# Patient Record
Sex: Male | Born: 1980 | Race: Black or African American | Hispanic: No | Marital: Single | Smoking: Current every day smoker
Health system: Southern US, Community
[De-identification: ages and names within clinical notes are randomized; demographics above are authoritative.]

## PROBLEM LIST (undated history)

## (undated) DIAGNOSIS — I2510 Atherosclerotic heart disease of native coronary artery without angina pectoris: Secondary | ICD-10-CM

## (undated) DIAGNOSIS — I219 Acute myocardial infarction, unspecified: Secondary | ICD-10-CM

## (undated) DIAGNOSIS — Z720 Tobacco use: Secondary | ICD-10-CM

## (undated) DIAGNOSIS — I10 Essential (primary) hypertension: Secondary | ICD-10-CM

## (undated) DIAGNOSIS — I739 Peripheral vascular disease, unspecified: Secondary | ICD-10-CM

---
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
## 1999-10-21 ENCOUNTER — Emergency Department (HOSPITAL_COMMUNITY): Admission: EM | Admit: 1999-10-21 | Discharge: 1999-10-21 | Payer: Self-pay | Admitting: Emergency Medicine

## 1999-10-21 ENCOUNTER — Encounter: Payer: Self-pay | Admitting: *Deleted

## 2008-03-16 ENCOUNTER — Emergency Department (HOSPITAL_COMMUNITY): Admission: EM | Admit: 2008-03-16 | Discharge: 2008-03-16 | Payer: Self-pay | Admitting: Emergency Medicine

## 2009-05-19 ENCOUNTER — Emergency Department (HOSPITAL_COMMUNITY): Admission: EM | Admit: 2009-05-19 | Discharge: 2009-05-19 | Payer: Self-pay | Admitting: Emergency Medicine

## 2011-03-25 ENCOUNTER — Emergency Department (HOSPITAL_COMMUNITY): Payer: Self-pay

## 2011-03-25 ENCOUNTER — Emergency Department (HOSPITAL_COMMUNITY)
Admission: EM | Admit: 2011-03-25 | Discharge: 2011-03-25 | Disposition: A | Discharge status: Home / Self Care | Payer: Self-pay | Attending: Emergency Medicine | Admitting: Emergency Medicine

## 2011-03-25 DIAGNOSIS — S0990XA Unspecified injury of head, initial encounter: Secondary | ICD-10-CM | POA: Insufficient documentation

## 2011-03-25 DIAGNOSIS — F411 Generalized anxiety disorder: Secondary | ICD-10-CM | POA: Insufficient documentation

## 2011-03-25 DIAGNOSIS — H538 Other visual disturbances: Secondary | ICD-10-CM | POA: Insufficient documentation

## 2011-03-25 DIAGNOSIS — IMO0002 Reserved for concepts with insufficient information to code with codable children: Secondary | ICD-10-CM | POA: Insufficient documentation

## 2011-03-25 DIAGNOSIS — R0789 Other chest pain: Secondary | ICD-10-CM | POA: Insufficient documentation

## 2011-03-25 DIAGNOSIS — R0602 Shortness of breath: Secondary | ICD-10-CM | POA: Insufficient documentation

## 2012-11-11 ENCOUNTER — Emergency Department (HOSPITAL_COMMUNITY)
Admission: EM | Admit: 2012-11-11 | Discharge: 2012-11-11 | Disposition: A | Discharge status: Home / Self Care | Payer: BC Managed Care – PPO | Source: Home / Self Care

## 2012-11-11 ENCOUNTER — Encounter (HOSPITAL_COMMUNITY): Payer: Self-pay | Admitting: Emergency Medicine

## 2012-11-11 DIAGNOSIS — J309 Allergic rhinitis, unspecified: Secondary | ICD-10-CM

## 2012-11-11 DIAGNOSIS — H6983 Other specified disorders of Eustachian tube, bilateral: Secondary | ICD-10-CM

## 2012-11-11 DIAGNOSIS — H6980 Other specified disorders of Eustachian tube, unspecified ear: Secondary | ICD-10-CM

## 2012-11-11 DIAGNOSIS — H6500 Acute serous otitis media, unspecified ear: Secondary | ICD-10-CM

## 2012-11-11 HISTORY — DX: Acute myocardial infarction, unspecified: I21.9

## 2012-11-11 MED ORDER — AMOXICILLIN-POT CLAVULANATE 875-125 MG PO TABS: 1.0000 | ORAL_TABLET | Freq: Two times a day (BID) | ORAL | Status: DC

## 2012-11-11 NOTE — ED Notes (Signed)
Pt c/o bilateral ear pain onset 2 months Pain has increased these past 2 days Denies: drainage, f/v/n/d, cold sx States he feels like fluid is inside   He is alert and oriented w/no signs of acute distress.

## 2012-11-11 NOTE — ED Provider Notes (Signed)
9History     CSN: [id number]  Arrival date & time 11/11/12  1204   None     Chief Complaint  Patient presents with  . Otalgia    (Consider location/radiation/quality/duration/timing/severity/associated sxs/prior treatment) HPI Comments: 32 year old male with complaints of right and left earache for over 2 months. In the past 2 days of right ear pain has worsened. He complains of his ears feeling stopped up and a foreign body sensation. He denies sore throat, fever or problems with hearing or swallowing.   Past Medical History  Diagnosis Date  . Heart attack     History reviewed. No pertinent past surgical history.  No family history on file.  History  Substance Use Topics  . Smoking status: Current Every Day Smoker  . Smokeless tobacco: Not on file  . Alcohol Use: No      Review of Systems  Constitutional: Negative for fever, diaphoresis, activity change and fatigue.  HENT: Positive for postnasal drip. Negative for ear pain, sore throat, facial swelling, rhinorrhea, trouble swallowing, neck pain and neck stiffness.   Eyes: Negative for pain, discharge and redness.  Respiratory: Negative for cough, chest tightness and shortness of breath.   Cardiovascular: Negative.   Gastrointestinal: Negative.   Musculoskeletal: Negative.   Neurological: Negative.     Allergies  Review of patient's allergies indicates no known allergies.  Home Medications   Current Outpatient Rx  Name  Route  Sig  Dispense  Refill  . amoxicillin-clavulanate (AUGMENTIN) 875-125 MG per tablet   Oral   Take 1 tablet by mouth every 12 (twelve) hours.   14 tablet   0     BP 131/88  Pulse 59  Temp(Src) 97.8 F (36.6 C) (Oral)  Resp 16  SpO2 96%  Physical Exam  Nursing note and vitals reviewed. Constitutional: He is oriented to person, place, and time. He appears well-developed and well-nourished. No distress.  HENT:  Right TM with minor erythema and bulging with serous fluid the  dependent position. Left TM. Normal the exception of retraction. Oropharynx clear with minor clear PND.  Eyes: Conjunctivae and EOM are normal.  Neck: Normal range of motion. Neck supple.  Cardiovascular: Normal rate, regular rhythm and normal heart sounds.   Pulmonary/Chest: Effort normal and breath sounds normal. No respiratory distress. He has no wheezes. He has no rales.  Musculoskeletal: Normal range of motion. He exhibits no edema.  Lymphadenopathy:    He has no cervical adenopathy.  Neurological: He is alert and oriented to person, place, and time.  Skin: Skin is warm and dry. No rash noted.  Psychiatric: He has a normal mood and affect.    ED Course  Procedures (including critical care time)  Labs Reviewed - No data to display No results found.   1. ETD (eustachian tube dysfunction), bilateral   2. Otitis media, acute serous, left   3. Allergic rhinitis       MDM  Augmentin 875 twice a day for 7 days Sudafed PE 10 mg every 4 hours Guaifenesin 100 200 mg every 4-6 hours when necessary congestion Claritin 10 mg daily Plenty of fluids        Hayden Rasmussen, NP 11/11/12 1458

## 2012-11-20 NOTE — ED Provider Notes (Signed)
Medical screening examination/treatment/procedure(s) were performed by resident physician or non-physician practitioner and as supervising physician I was immediately available for consultation/collaboration.   Taura Lamarre DOUGLAS MD.   Darlyn Repsher D Alayiah Fontes, MD 11/20/12 1015 

## 2014-06-21 ENCOUNTER — Other Ambulatory Visit: Payer: Self-pay | Admitting: Family Medicine

## 2014-06-21 DIAGNOSIS — R29898 Other symptoms and signs involving the musculoskeletal system: Secondary | ICD-10-CM

## 2014-06-21 DIAGNOSIS — R200 Anesthesia of skin: Secondary | ICD-10-CM

## 2014-06-22 ENCOUNTER — Ambulatory Visit
Admission: RE | Admit: 2014-06-22 | Discharge: 2014-06-22 | Disposition: A | Discharge status: Home / Self Care | Payer: Medicaid Other | Source: Ambulatory Visit | Attending: Family Medicine | Admitting: Family Medicine

## 2014-06-22 DIAGNOSIS — R200 Anesthesia of skin: Secondary | ICD-10-CM

## 2014-06-22 DIAGNOSIS — R29898 Other symptoms and signs involving the musculoskeletal system: Secondary | ICD-10-CM

## 2014-12-18 ENCOUNTER — Encounter (HOSPITAL_COMMUNITY): Payer: Self-pay | Admitting: *Deleted

## 2014-12-18 ENCOUNTER — Emergency Department (HOSPITAL_COMMUNITY)
Admission: EM | Admit: 2014-12-18 | Discharge: 2014-12-18 | Disposition: A | Discharge status: Home / Self Care | Payer: Medicaid Other | Attending: Emergency Medicine | Admitting: Emergency Medicine

## 2014-12-18 DIAGNOSIS — H9201 Otalgia, right ear: Secondary | ICD-10-CM

## 2014-12-18 DIAGNOSIS — Z720 Tobacco use: Secondary | ICD-10-CM | POA: Insufficient documentation

## 2014-12-18 DIAGNOSIS — Z8679 Personal history of other diseases of the circulatory system: Secondary | ICD-10-CM | POA: Diagnosis not present

## 2014-12-18 MED ORDER — AMOXICILLIN-POT CLAVULANATE 875-125 MG PO TABS: 1.0000 | ORAL_TABLET | Freq: Two times a day (BID) | ORAL | Status: DC

## 2014-12-18 MED ORDER — IBUPROFEN 800 MG PO TABS: 800.0000 mg | ORAL_TABLET | Freq: Three times a day (TID) | ORAL | Status: DC

## 2014-12-18 MED ORDER — PSEUDOEPHEDRINE HCL 60 MG PO TABS: 60.0000 mg | ORAL_TABLET | Freq: Four times a day (QID) | ORAL | Status: DC | PRN

## 2014-12-18 NOTE — ED Notes (Signed)
Pt states that he has had rt ear pain for over one year pt [facility]

## 2014-12-18 NOTE — Discharge Instructions (Signed)
Please read and follow all provided instructions.  Your diagnoses today include:  1. Ear pain, right     Tests performed today include:  Vital signs. See below for your results today.   Medications prescribed:   Augmentin - antibiotic  You have been prescribed an antibiotic medicine: take the entire course of medicine even if you are feeling better. Stopping early can cause the antibiotic not to work.   Ibuprofen (Motrin, Advil) - anti-inflammatory pain medication  Do not exceed 600mg  ibuprofen every 6 hours, take with food  You have been prescribed an anti-inflammatory medication or NSAID. Take with food. Take smallest effective dose for the shortest duration needed for your pain. Stop taking if you experience stomach pain or vomiting.    Sudafed - medication to help open tubes  Take any prescribed medications only as directed.  Home care instructions:  Follow any educational materials contained in this packet.  BE VERY CAREFUL not to take multiple medicines containing Tylenol (also called acetaminophen). Doing so can lead to an overdose which can damage your liver and cause liver failure and possibly death.   Follow-up instructions: Please follow-up with your primary care provider or the ear doctor listed in the next 7 days for further evaluation of your symptoms.   Return instructions:   Please return to the Emergency Department if you experience worsening symptoms.   Please return if you have any other emergent concerns.  Additional Information:  Your vital signs today were: BP 135/85 mmHg   Pulse 89   Temp(Src) 97.9 F (36.6 C) (Oral)   Resp 18   SpO2 99% If your blood pressure (BP) was elevated above 135/85 this visit, please have this repeated by your doctor within one month. --------------

## 2014-12-18 NOTE — ED Provider Notes (Signed)
CSN: [id number]     Arrival date & time 12/18/14  1104 History   First MD Initiated Contact with Patient 12/18/14 1237     Chief Complaint  Patient presents with  . Otalgia     (Consider location/radiation/quality/duration/timing/severity/associated sxs/prior Treatment) HPI Comments: Patient resents with complaint of intermittent bilateral ear pain which is been occurring for at least the past 2 years, worsening right ear pain starting approximately 3 days ago. He has been seen in the past at urgent care and was diagnosed with eustachian tube dysfunction. Patient denies fever, nasal congestion or allergy symptoms, other URI symptoms. No problems with hearing. He states that he saw an ENT in the past and states that he had a normal exam. He was given some eardrops which did not help. Patient has been taking ibuprofen with some relief of pain.  The history is provided by the patient and medical records.    Past Medical History  Diagnosis Date  . Heart attack    History reviewed. No pertinent past surgical history. No family history on file. History  Substance Use Topics  . Smoking status: Current Every Day Smoker  . Smokeless tobacco: Not on file  . Alcohol Use: No    Review of Systems  Constitutional: Negative for fever, chills and fatigue.  HENT: Positive for ear pain. Negative for congestion, rhinorrhea, sinus pressure and sore throat.   Eyes: Negative for redness.  Respiratory: Negative for cough and wheezing.   Gastrointestinal: Negative for nausea, vomiting, abdominal pain and diarrhea.  Genitourinary: Negative for dysuria.  Musculoskeletal: Negative for myalgias and neck stiffness.  Skin: Negative for rash.  Neurological: Negative for headaches.  Hematological: Negative for adenopathy.    Allergies  Review of patient's allergies indicates no known allergies.  Home Medications   Prior to Admission medications   Medication Sig Start Date End Date Taking? Authorizing  Provider  amoxicillin-clavulanate (AUGMENTIN) 875-125 MG per tablet Take 1 tablet by mouth every 12 (twelve) hours. 11/11/12   Hayden Rasmussenavid Mabe, NP   BP 135/85 mmHg  Pulse 89  Temp(Src) 97.9 F (36.6 C) (Oral)  Resp 18  SpO2 99%   Physical Exam  Constitutional: He appears well-developed and well-nourished.  HENT:  Head: Normocephalic and atraumatic.  Right Ear: External ear and ear canal normal. No swelling or tenderness. Tympanic membrane is injected and erythematous. No middle ear effusion.  Left Ear: External ear and ear canal normal. Tympanic membrane is retracted.  Eyes: Conjunctivae are normal.  Neck: Normal range of motion. Neck supple.  Pulmonary/Chest: No respiratory distress.  Neurological: He is alert.  Skin: Skin is warm and dry.  Psychiatric: He has a normal mood and affect.  Nursing note and vitals reviewed.   ED Course  Procedures (including critical care time) Labs Review Labs Reviewed - No data to display  Imaging Review No results found.   EKG Interpretation None       12:54 PM Patient seen and examined.   Vital signs reviewed and are as follows: BP 135/85 mmHg  Pulse 89  Temp(Src) 97.9 F (36.6 C) (Oral)  Resp 18  SpO2 99%  Plan: augmentin, sudafed, ibuprofen, ENT referral.  Patient urged to return with worsening symptoms or other concerns. Patient verbalized understanding and agrees with plan.   MDM   Final diagnoses:  Ear pain, right   Patient with right-sided ear pain. This is chronic in nature patient has acutely injected erythematous right TM today without bulging. Will cover for otitis media with  Augmentin. Also possible element of eustachian tube dysfunction given chronic nature. Sudafed given. ENT follow-up given. Pain control with ibuprofen. No signs of otitis externa. No systemic symptoms of illness.   Renne Crigler, PA-C 12/18/14 1540  Margarita Grizzle, MD 12/18/14 2026

## 2014-12-18 NOTE — ED Notes (Signed)
Declined W/C at D/C and was escorted to lobby by RN. 

## 2015-07-17 DIAGNOSIS — I739 Peripheral vascular disease, unspecified: Secondary | ICD-10-CM

## 2015-07-17 NOTE — H&P (Addendum)
OFFICE VISIT NOTES COPIED TO EPIC FOR DOCUMENTATION  [name] 06/23/2015 9:04 AM Location: [facility] Patient #: [id number] DOB: May 24, 1981 Single / Language: Lenox PondsEnglish / Race: Refused to Report/Unreported Male   History of Present Illness Marcy Salvo(Bridgette Allison AGNP-C; 06/23/2015 5:34 PM) The patient is a 35 year old male who presents with chest pain. He presents for evaluation due to history of MI in 2002. No records available prior cardiac event, however he states he had a coronary angiogram and no stents were placed. He has a history of hypertension, hyperglycemia, and hyperlipidemia as well as erectile dysfunction and continued tobacco use. He denies any chest pain, dyspnea, PND, orthopnea, edema, dizziness, syncope, or symptoms suggestive of TIA. He does report symptoms of claudication, particularly in his right hip. Denies any discoloration of extremities.  He had echocardiogram and lower extremity duplex and presents today for follow-up.   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 5 Daily Alcohol Use Moderate alcohol use. Marital status Divorced. Drug Use Uses  marijuana. Number of Children 2. Living Situation Lives with his Mother  Past Surgical History Alysia Penna(Charavina Reader; 06/23/2015 11:24 AM) Left [id number] Nerve, Artery & Tendon repair  Medication History (Charavina Reader; 06/23/2015 11:34 AM) Aspirin (81MG  Tablet, 1 Oral as needed) Active. Medications Reconciled  Diagnostic Studies History Lorene Dy(Christie Beane; 06/23/2015 9:05 AM) Echocardiogram12/07/2014 Left ventricle cavity is normal in size. Moderate concentric hypertrophy of the left ventricle. Normal global wall motion. Normal diastolic filling pattern. Calculated EF 65%. Trace tricuspid regurgitation. Unable to estimate PA pressure due to absence/minimal TR signal. 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 No significant disease in the left lower extremity. This exam reveals moderately decreased perfusion of the right lower extremity with ABI 0.72 and normal perfusion left at 1.04 noted at the post tibial artery level.    Review of Systems [facility](Bridgette Revonda Standardllison, [place]NP-C; 06/23/2015 5:38 PM) General Not Present- Anorexia, Fatigue and Fever. Respiratory Not Present- Cough, Decreased Exercise Tolerance and Dyspnea. Cardiovascular Present- Claudications. Not Present- Chest Pain, Edema, Orthopnea, Paroxysmal Nocturnal Dyspnea and Shortness of Breath. Gastrointestinal Not Present- Change in Bowel Habits, Constipation and Nausea. Neurological Not Present- Focal Neurological Symptoms. Endocrine Not Present- Appetite Changes, Cold Intolerance and Heat Intolerance. Hematology Not Present- Anemia, Petechiae and Prolonged Bleeding.  Vitals Alysia Penna(Charavina Reader; 06/23/2015 11:30 AM) 06/23/2015 11:27 AM Weight: 192.06 lb Height: 74in Body Surface Area: 2.14 m Body Mass Index: 24.66 kg/m  Pulse: 82 (Regular)  P.OX: 98% (Room air) BP: 132/88 (Sitting, Left Arm, Standard)       Physical Exam  (Bridgette Revonda Standardllison, AGNP-C; 06/23/2015 5:38 PM) General Mental Status-Alert. General Appearance-Cooperative, Appears stated age, Not in acute distress. Orientation-Oriented X3. Build & Nutrition-Lean and Moderately built.  Head and Neck Thyroid Gland Characteristics - no palpable nodules, no palpable enlargement.  Chest and Lung Exam Chest and lung exam reveals -on  auscultation, normal breath sounds, no adventitious sounds and normal vocal resonance. Palpation Tender - No chest wall tenderness.  Cardiovascular Inspection Jugular vein - Right - No Distention. Auscultation Heart Sounds - S1 WNL, S2 WNL and No gallop present. Murmurs & Other Heart Sounds - Murmur - No murmur.  Abdomen Palpation/Percussion Normal exam - Non Tender and No hepatosplenomegaly. Auscultation Normal exam - Bowel sounds normal.  Peripheral Vascular Lower Extremity Inspection - Left - No Pigmentation, No Varicose veins. Right - No Pigmentation, No Varicose veins. Palpation - Edema - Bilateral - No edema. Femoral pulse - Bilateral - 1+(bruit present). Popliteal pulse - Left - 1+. Right - Absent. Dorsalis pedis pulse - Bilateral - Feeble. Posterior tibial pulse - Bilateral - Feeble. Carotid arteries - Bilateral-No Carotid bruit. Abdomen-No prominent abdominal aortic pulsation, No epigastric bruit.  Neurologic Motor-Grossly intact without any focal deficits.  Musculoskeletal Global Assessment Left Lower Extremity - normal range of motion without pain. Right Lower Extremity - normal range of motion without pain.    Assessment & Plan (Bridgette Revonda Standard AGNP-C; 06/23/2015 5:37 PM) History of MI (myocardial infarction) (I25.2) Story: In [place], coronary angiogram: no stents Impression: EKG 05/31/2015: Normal sinus rhythm at rate of 71 bpm, left atrial enlargement, incomplete right bundle branch block. No evidence of ischemia. Normal EKG otherwise.  Echocardiogram12/07/2014 Left ventricle  cavity is normal in size. Moderate concentric hypertrophy of the left ventricle. Normal global wall motion. Normal diastolic filling pattern. Calculated EF 65%. Trace tricuspid regurgitation. Unable to estimate PA pressure due to absence/minimal TR signal.  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 No significant disease in the left lower extremity. This exam reveals moderately decreased perfusion of the right lower extremity with ABI 0.72 and normal perfusion left at 1.04 noted at the post tibial artery level. Future Plans 07/06/2015: METABOLIC PANEL, BASIC [phone]) - one time 07/06/2015: CBC & PLATELETS (AUTO) (60454) - one time 07/06/2015: PT (PROTHROMBIN TIME) (09811) - one time Benign essential hypertension (I10) Mild hyperlipidemia (E78.5) Current Plans Started Atorvastatin Calcium 20MG , 1 (one) Tablet daily, #30, 30 days starting 06/23/2015, Ref. x1. Tobacco use disorder (F17.200) Current Plans Mechanism of underlying disease process and action of medications discussed with the patient. I discussed primary/secondary prevention and also dietary counseling was done. He presents for follow-up of echocardiogram and lower extremity duplex. Echocardiogram revealed moderate concentric LVH with normal LVEF. Lower extremity duplex suggest high-grade right proximal iliac stenosis with moderately decreased right lower extremity perfusion with ABI 0.72. Patient continues to report severe right hip pain and claudication which is significantly impairing his ability to walk and work. Needs to schedule for peripheral arteriogram and possible angioplasty given symptoms. Patient understands the risks, benefits, alternatives including medical therapy, CT angiography. Patient understands <1-2% risk of death, embolic complications, bleeding, infection,  renal failure, urgent surgical revascularization, but not limited to these and wants to proceed. Again discussed medical therapy. Patient was advised that he would need to be compliant with total antiplatelet therapy following angioplasty to avoid complications. Patient is agreeable to this. Also discussed statin therapy. Patient is still hesitant, however states he will consider this. Lipitor prescription sent in. Follow-up after angiogram for reevaluation and further recommendations. Addendum Note(Jagadeesh Tomasita Crumble MD; 07/17/2015 10:24 AM) Labs 07/14/2015: Serum glucose 93 mg, BUN 13, serum creatinine 1.18, potassium 4.9. Hemoglobin 15.5/hematocrit 44.6, white count 11.0. Platelet count 216. Pro time 11. Labs  are normal and stable for proceeding with peripheral angiography.     Signed by Marcy Salvo, AGNP-C (06/23/2015 5:38 PM)

## 2015-07-18 ENCOUNTER — Ambulatory Visit (HOSPITAL_COMMUNITY)
Admission: RE | Admit: 2015-07-18 | Discharge: 2015-07-18 | Disposition: A | Discharge status: Home / Self Care | Payer: Medicaid Other | Source: Ambulatory Visit | Attending: Cardiology | Admitting: Cardiology

## 2015-07-18 ENCOUNTER — Encounter (HOSPITAL_COMMUNITY)
Admission: RE | Disposition: A | Discharge status: Home / Self Care | Payer: Self-pay | Source: Ambulatory Visit | Attending: Cardiology

## 2015-07-18 DIAGNOSIS — I10 Essential (primary) hypertension: Secondary | ICD-10-CM | POA: Diagnosis not present

## 2015-07-18 DIAGNOSIS — N529 Male erectile dysfunction, unspecified: Secondary | ICD-10-CM | POA: Insufficient documentation

## 2015-07-18 DIAGNOSIS — F419 Anxiety disorder, unspecified: Secondary | ICD-10-CM | POA: Insufficient documentation

## 2015-07-18 DIAGNOSIS — F329 Major depressive disorder, single episode, unspecified: Secondary | ICD-10-CM | POA: Diagnosis not present

## 2015-07-18 DIAGNOSIS — F1290 Cannabis use, unspecified, uncomplicated: Secondary | ICD-10-CM | POA: Diagnosis not present

## 2015-07-18 DIAGNOSIS — I252 Old myocardial infarction: Secondary | ICD-10-CM | POA: Diagnosis not present

## 2015-07-18 DIAGNOSIS — I70211 Atherosclerosis of native arteries of extremities with intermittent claudication, right leg: Secondary | ICD-10-CM | POA: Insufficient documentation

## 2015-07-18 DIAGNOSIS — R739 Hyperglycemia, unspecified: Secondary | ICD-10-CM | POA: Diagnosis not present

## 2015-07-18 DIAGNOSIS — F17210 Nicotine dependence, cigarettes, uncomplicated: Secondary | ICD-10-CM | POA: Insufficient documentation

## 2015-07-18 DIAGNOSIS — E785 Hyperlipidemia, unspecified: Secondary | ICD-10-CM | POA: Insufficient documentation

## 2015-07-18 DIAGNOSIS — I739 Peripheral vascular disease, unspecified: Secondary | ICD-10-CM

## 2015-07-18 DIAGNOSIS — I7092 Chronic total occlusion of artery of the extremities: Secondary | ICD-10-CM | POA: Diagnosis not present

## 2015-07-18 HISTORY — PX: PERIPHERAL VASCULAR CATHETERIZATION: SHX172C

## 2015-07-18 LAB — POCT ACTIVATED CLOTTING TIME
ACTIVATED CLOTTING TIME: 193 s
ACTIVATED CLOTTING TIME: 229 s
ACTIVATED CLOTTING TIME: 162 s
Activated Clotting Time: 188 seconds
Activated Clotting Time: 193 seconds

## 2015-07-18 SURGERY — ABDOMINAL AORTOGRAM

## 2015-07-18 MED ORDER — HEPARIN SODIUM (PORCINE) 1000 UNIT/ML IJ SOLN
INTRAMUSCULAR | Status: DC | PRN
  Administered 2015-07-18: 1500 [IU] via INTRAVENOUS
  Administered 2015-07-18: 4000 [IU] via INTRAVENOUS

## 2015-07-18 MED ORDER — HYDRALAZINE HCL 20 MG/ML IJ SOLN: 10.0000 mg | INTRAMUSCULAR | Status: DC | PRN

## 2015-07-18 MED ORDER — FENTANYL CITRATE (PF) 100 MCG/2ML IJ SOLN
INTRAMUSCULAR | Status: DC | PRN
  Administered 2015-07-18: 50 ug via INTRAVENOUS

## 2015-07-18 MED ORDER — LABETALOL HCL 5 MG/ML IV SOLN
INTRAVENOUS | Status: AC
  Filled 2015-07-18: qty 4

## 2015-07-18 MED ORDER — CLOPIDOGREL BISULFATE 300 MG PO TABS
ORAL_TABLET | ORAL | Status: AC
  Filled 2015-07-18: qty 2

## 2015-07-18 MED ORDER — ASPIRIN 81 MG PO CHEW
CHEWABLE_TABLET | ORAL | Status: AC
  Filled 2015-07-18: qty 4

## 2015-07-18 MED ORDER — LABETALOL HCL 5 MG/ML IV SOLN
INTRAVENOUS | Status: DC | PRN
  Administered 2015-07-18: 20 mg via INTRAVENOUS

## 2015-07-18 MED ORDER — HEPARIN SODIUM (PORCINE) 1000 UNIT/ML IJ SOLN
INTRAMUSCULAR | Status: AC
  Filled 2015-07-18: qty 1

## 2015-07-18 MED ORDER — SODIUM CHLORIDE 0.9 % IV SOLN
1.0000 mL/kg/h | INTRAVENOUS | Status: DC
  Administered 2015-07-18: 1 mL/kg/h via INTRAVENOUS

## 2015-07-18 MED ORDER — CARVEDILOL 12.5 MG PO TABS: 12.5000 mg | ORAL_TABLET | Freq: Two times a day (BID) | ORAL | Status: AC

## 2015-07-18 MED ORDER — SODIUM CHLORIDE 0.9 % IV SOLN
INTRAVENOUS | Status: DC
  Administered 2015-07-18: 1000 mL via INTRAVENOUS

## 2015-07-18 MED ORDER — MIDAZOLAM HCL 2 MG/2ML IJ SOLN
INTRAMUSCULAR | Status: AC
  Filled 2015-07-18: qty 2

## 2015-07-18 MED ORDER — ONDANSETRON HCL 4 MG/2ML IJ SOLN
INTRAMUSCULAR | Status: AC
  Filled 2015-07-18: qty 2

## 2015-07-18 MED ORDER — CLOPIDOGREL BISULFATE 300 MG PO TABS
ORAL_TABLET | ORAL | Status: DC | PRN
  Administered 2015-07-18: 600 mg via ORAL

## 2015-07-18 MED ORDER — HEPARIN (PORCINE) IN NACL 2-0.9 UNIT/ML-% IJ SOLN
INTRAMUSCULAR | Status: AC
  Filled 2015-07-18: qty 1000

## 2015-07-18 MED ORDER — IODIXANOL 320 MG/ML IV SOLN
INTRAVENOUS | Status: DC | PRN
  Administered 2015-07-18: 195 mL via INTRA_ARTERIAL

## 2015-07-18 MED ORDER — MIDAZOLAM HCL 2 MG/2ML IJ SOLN
INTRAMUSCULAR | Status: DC | PRN
  Administered 2015-07-18 (×2): 3 mg via INTRAVENOUS

## 2015-07-18 MED ORDER — ASPIRIN EC 81 MG PO TBEC: 81.0000 mg | DELAYED_RELEASE_TABLET | Freq: Every day | ORAL | Status: AC

## 2015-07-18 MED ORDER — LIDOCAINE HCL (PF) 1 % IJ SOLN
INTRAMUSCULAR | Status: AC
  Filled 2015-07-18: qty 30

## 2015-07-18 MED ORDER — CLOPIDOGREL BISULFATE 75 MG PO TABS: 75.0000 mg | ORAL_TABLET | Freq: Every day | ORAL | Status: AC

## 2015-07-18 MED ORDER — FENTANYL CITRATE (PF) 100 MCG/2ML IJ SOLN
INTRAMUSCULAR | Status: AC
  Filled 2015-07-18: qty 2

## 2015-07-18 MED ORDER — ONDANSETRON HCL 4 MG/2ML IJ SOLN
4.0000 mg | Freq: Once | INTRAMUSCULAR | Status: AC
  Administered 2015-07-18: 4 mg via INTRAVENOUS

## 2015-07-18 MED ORDER — ASPIRIN 81 MG PO CHEW
CHEWABLE_TABLET | ORAL | Status: DC | PRN
  Administered 2015-07-18: 324 mg via ORAL

## 2015-07-18 MED ORDER — FENTANYL CITRATE (PF) 100 MCG/2ML IJ SOLN: 50.0000 ug | INTRAMUSCULAR | Status: DC | PRN

## 2015-07-18 MED ORDER — LIDOCAINE HCL (PF) 1 % IJ SOLN
INTRAMUSCULAR | Status: DC | PRN
  Administered 2015-07-18: 12:00:00

## 2015-07-18 SURGICAL SUPPLY — 27 items
BALLN COYOTE OTW 2.5X60X150 (BALLOONS) ×4
BALLN MUSTANG 6X100X135 (BALLOONS) ×4
BALLN STERLI SL OTW 2.5X80X150 (BALLOONS) ×4
BALLOON COYOTE OTW 2.5X60X150 (BALLOONS) IMPLANT
BALLOON MUSTANG 6X100X135 (BALLOONS) IMPLANT
BALLOON STRLNG OTW 2.5X80X150 (BALLOONS) IMPLANT
CATH OMNI FLUSH 5F 65CM (CATHETERS) ×2 IMPLANT
CATH QUICKCROSS SUPP .018X90CM (MICROCATHETER) ×2 IMPLANT
CATH SOFT-VU 4F 65 STRAIGHT (CATHETERS) IMPLANT
CATH SOFT-VU STRAIGHT 4F 65CM (CATHETERS) ×4
COVER PRB 48X5XTLSCP FOLD TPE (BAG) IMPLANT
COVER PROBE 5X48 (BAG) ×4
KIT ENCORE 26 ADVANTAGE (KITS) ×2 IMPLANT
KIT MICROINTRODUCER STIFF 5F (SHEATH) ×2 IMPLANT
KIT PV (KITS) ×4 IMPLANT
SHEATH BRITE TIP 7FR 35CM (SHEATH) ×2 IMPLANT
SHEATH PINNACLE 5F 10CM (SHEATH) ×4 IMPLANT
SHEATH PINNACLE 7F 10CM (SHEATH) ×2 IMPLANT
STENT EXPRESS LD 8X37X135 (Permanent Stent) ×2 IMPLANT
STENT INNOVA 8X60X130 (Permanent Stent) ×2 IMPLANT
STOPCOCK MORSE 400PSI 3WAY (MISCELLANEOUS) ×2 IMPLANT
SYR MEDRAD MARK V 150ML (SYRINGE) ×4 IMPLANT
TRANSDUCER W/STOPCOCK (MISCELLANEOUS) ×4 IMPLANT
TRAY PV CATH (CUSTOM PROCEDURE TRAY) ×4 IMPLANT
WIRE HITORQ VERSACORE ST 145CM (WIRE) ×2 IMPLANT
WIRE TREASURE-12 .018X300CM (WIRE) ×2 IMPLANT
WIRE VERSACORE LOC 115CM (WIRE) ×2 IMPLANT

## 2015-07-18 NOTE — Progress Notes (Signed)
Pt unable to void, does not want to be in and out foley cath at this time. Dr Jacinto HalimGanji notified and orders followed.

## 2015-07-18 NOTE — Discharge Instructions (Signed)
Angiogram, Care After °Refer to this sheet in the next few weeks. These instructions provide you with information about caring for yourself after your procedure. Your health care provider may also give you more specific instructions. Your treatment has been planned according to current medical practices, but problems sometimes occur. Call your health care provider if you have any problems or questions after your procedure. °WHAT TO EXPECT AFTER THE PROCEDURE °After your procedure, it is typical to have the following: °· Bruising at the catheter insertion site that usually fades within 1-2 weeks. °· Blood collecting in the tissue (hematoma) that may be painful to the touch. It should usually decrease in size and tenderness within 1-2 weeks. °HOME CARE INSTRUCTIONS °· Take medicines only as directed by your health care provider. °· You may shower 24-48 hours after the procedure or as directed by your health care provider. Remove the bandage (dressing) and gently wash the site with plain soap and water. Pat the area dry with a clean towel. Do not rub the site, because this may cause bleeding. °· Do not take baths, swim, or use a hot tub until your health care provider approves. °· Check your insertion site every day for redness, swelling, or drainage. °· Do not apply powder or lotion to the site. °· Do not lift over 10 lb (4.5 kg) for 5 days after your procedure or as directed by your health care provider. °· Ask your health care provider when it is okay to: °¨ Return to work or school. °¨ Resume usual physical activities or sports. °¨ Resume sexual activity. °· Do not drive home if you are discharged the same day as the procedure. Have someone else drive you. °· You may drive 24 hours after the procedure unless otherwise instructed by your health care provider. °· Do not operate machinery or power tools for 24 hours after the procedure or as directed by your health care provider. °· If your procedure was done as an  outpatient procedure, which means that you went home the same day as your procedure, a responsible adult should be with you for the first 24 hours after you arrive home. °· Keep all follow-up visits as directed by your health care provider. This is important. °SEEK MEDICAL CARE IF: °· You have a fever. °· You have chills. °· You have increased bleeding from the catheter insertion site. Hold pressure on the site.  CALL 911 °SEEK IMMEDIATE MEDICAL CARE IF: °· You have unusual pain at the catheter insertion site. °· You have redness, warmth, or swelling at the catheter insertion site. °· You have drainage (other than a small amount of blood on the dressing) from the catheter insertion site. °· The catheter insertion site is bleeding, and the bleeding does not stop after 30 minutes of holding steady pressure on the site. °· The area near or just beyond the catheter insertion site becomes pale, cool, tingly, or numb. °  °This information is not intended to replace advice given to you by your health care provider. Make sure you discuss any questions you have with your health care provider. °  °Document Released: 01/17/2005 Document Revised: 07/22/2014 Document Reviewed: 12/02/2012 °Elsevier Interactive Patient Education ©2016 [employer] ° °

## 2015-07-18 NOTE — Progress Notes (Signed)
Pt wants to get up, he is laying on his side. He has bilateral artery groin sites, he was re taught importance of laying flat and still. Pt getting agitated. Will continue to monitor.

## 2015-07-18 NOTE — Progress Notes (Signed)
867fr sheath aspirated and removed from rfa. Manual pressure applied for 20 minutes. Site re-bled. Manual pressure held additional 20 minutes. Site re-bled again. Manual pressure held additional 20 minutes, hemostasis achieved.   Manual pressure total time 1 hr on right groin.  285fr sheath aspirated and removed from LFA, manual pressure applied for 20 minutes, hemostasis achieved.   Tegaderm dressings applied to both sites, bedrest instructions given.   Bilateral DP and PT pulses palpable.  Bedrest begins at 13:55:00

## 2015-07-18 NOTE — Interval H&P Note (Signed)
History and Physical Interval Note:  07/18/2015 9:42 AM  [name]  has presented today for surgery, with the diagnosis of PVD  The various methods of treatment have been discussed with the patient and family. After consideration of risks, benefits and other options for treatment, the patient has consented to  Procedure(s): Lower Extremity Angiography (N/A) and possible PTA as a surgical intervention .  The patient's history has been reviewed, patient examined, no change in status, stable for surgery.  I have reviewed the patient's chart and labs.  Questions were answered to the patient's satisfaction.     [name], [name]

## 2015-07-19 ENCOUNTER — Encounter (HOSPITAL_COMMUNITY): Payer: Self-pay | Admitting: Cardiology

## 2021-12-28 ENCOUNTER — Inpatient Hospital Stay (HOSPITAL_COMMUNITY): Payer: BC Managed Care – PPO | Admitting: Anesthesiology

## 2021-12-28 ENCOUNTER — Inpatient Hospital Stay (HOSPITAL_COMMUNITY)
Admission: EM | Admit: 2021-12-28 | Discharge: 2022-01-12 | DRG: 215 | Disposition: E | Payer: BC Managed Care – PPO | Attending: Cardiothoracic Surgery | Admitting: Cardiothoracic Surgery

## 2021-12-28 ENCOUNTER — Encounter (HOSPITAL_COMMUNITY): Admission: EM | Disposition: E | Payer: Self-pay | Source: Home / Self Care | Attending: Cardiothoracic Surgery

## 2021-12-28 ENCOUNTER — Encounter (HOSPITAL_COMMUNITY): Payer: Self-pay

## 2021-12-28 ENCOUNTER — Inpatient Hospital Stay (HOSPITAL_COMMUNITY): Payer: BC Managed Care – PPO

## 2021-12-28 ENCOUNTER — Emergency Department (HOSPITAL_COMMUNITY): Payer: BC Managed Care – PPO

## 2021-12-28 DIAGNOSIS — Z955 Presence of coronary angioplasty implant and graft: Secondary | ICD-10-CM | POA: Diagnosis not present

## 2021-12-28 DIAGNOSIS — T8110XA Postprocedural shock unspecified, initial encounter: Secondary | ICD-10-CM | POA: Diagnosis not present

## 2021-12-28 DIAGNOSIS — I700 Atherosclerosis of aorta: Secondary | ICD-10-CM | POA: Diagnosis present

## 2021-12-28 DIAGNOSIS — R570 Cardiogenic shock: Secondary | ICD-10-CM | POA: Diagnosis not present

## 2021-12-28 DIAGNOSIS — I252 Old myocardial infarction: Secondary | ICD-10-CM

## 2021-12-28 DIAGNOSIS — Z7982 Long term (current) use of aspirin: Secondary | ICD-10-CM

## 2021-12-28 DIAGNOSIS — K7200 Acute and subacute hepatic failure without coma: Secondary | ICD-10-CM | POA: Diagnosis not present

## 2021-12-28 DIAGNOSIS — I110 Hypertensive heart disease with heart failure: Secondary | ICD-10-CM | POA: Diagnosis present

## 2021-12-28 DIAGNOSIS — Z95820 Peripheral vascular angioplasty status with implants and grafts: Secondary | ICD-10-CM

## 2021-12-28 DIAGNOSIS — Z66 Do not resuscitate: Secondary | ICD-10-CM | POA: Diagnosis not present

## 2021-12-28 DIAGNOSIS — Z79899 Other long term (current) drug therapy: Secondary | ICD-10-CM

## 2021-12-28 DIAGNOSIS — R053 Chronic cough: Secondary | ICD-10-CM | POA: Diagnosis present

## 2021-12-28 DIAGNOSIS — E872 Acidosis, unspecified: Secondary | ICD-10-CM | POA: Diagnosis not present

## 2021-12-28 DIAGNOSIS — I10 Essential (primary) hypertension: Secondary | ICD-10-CM | POA: Diagnosis not present

## 2021-12-28 DIAGNOSIS — I7100 Dissection of unspecified site of aorta: Secondary | ICD-10-CM

## 2021-12-28 DIAGNOSIS — D689 Coagulation defect, unspecified: Secondary | ICD-10-CM | POA: Diagnosis not present

## 2021-12-28 DIAGNOSIS — T8249XA Other complication of vascular dialysis catheter, initial encounter: Secondary | ICD-10-CM | POA: Diagnosis not present

## 2021-12-28 DIAGNOSIS — Z7902 Long term (current) use of antithrombotics/antiplatelets: Secondary | ICD-10-CM

## 2021-12-28 DIAGNOSIS — I97638 Postprocedural hematoma of a circulatory system organ or structure following other circulatory system procedure: Secondary | ICD-10-CM | POA: Diagnosis not present

## 2021-12-28 DIAGNOSIS — I160 Hypertensive urgency: Secondary | ICD-10-CM | POA: Diagnosis present

## 2021-12-28 DIAGNOSIS — I249 Acute ischemic heart disease, unspecified: Principal | ICD-10-CM | POA: Diagnosis present

## 2021-12-28 DIAGNOSIS — I351 Nonrheumatic aortic (valve) insufficiency: Secondary | ICD-10-CM | POA: Diagnosis present

## 2021-12-28 DIAGNOSIS — Z720 Tobacco use: Secondary | ICD-10-CM | POA: Diagnosis not present

## 2021-12-28 DIAGNOSIS — D62 Acute posthemorrhagic anemia: Secondary | ICD-10-CM | POA: Diagnosis not present

## 2021-12-28 DIAGNOSIS — I7101 Dissection of ascending aorta: Secondary | ICD-10-CM | POA: Diagnosis present

## 2021-12-28 DIAGNOSIS — I25118 Atherosclerotic heart disease of native coronary artery with other forms of angina pectoris: Secondary | ICD-10-CM

## 2021-12-28 DIAGNOSIS — I5082 Biventricular heart failure: Secondary | ICD-10-CM | POA: Diagnosis present

## 2021-12-28 DIAGNOSIS — R451 Restlessness and agitation: Secondary | ICD-10-CM | POA: Diagnosis present

## 2021-12-28 DIAGNOSIS — J80 Acute respiratory distress syndrome: Secondary | ICD-10-CM | POA: Diagnosis not present

## 2021-12-28 DIAGNOSIS — E876 Hypokalemia: Secondary | ICD-10-CM | POA: Diagnosis present

## 2021-12-28 DIAGNOSIS — E638 Other specified nutritional deficiencies: Secondary | ICD-10-CM | POA: Diagnosis not present

## 2021-12-28 DIAGNOSIS — I200 Unstable angina: Secondary | ICD-10-CM | POA: Diagnosis not present

## 2021-12-28 DIAGNOSIS — Z515 Encounter for palliative care: Secondary | ICD-10-CM

## 2021-12-28 DIAGNOSIS — Y838 Other surgical procedures as the cause of abnormal reaction of the patient, or of later complication, without mention of misadventure at the time of the procedure: Secondary | ICD-10-CM | POA: Diagnosis not present

## 2021-12-28 DIAGNOSIS — I2510 Atherosclerotic heart disease of native coronary artery without angina pectoris: Secondary | ICD-10-CM

## 2021-12-28 DIAGNOSIS — F17200 Nicotine dependence, unspecified, uncomplicated: Secondary | ICD-10-CM | POA: Diagnosis not present

## 2021-12-28 DIAGNOSIS — I739 Peripheral vascular disease, unspecified: Secondary | ICD-10-CM | POA: Diagnosis present

## 2021-12-28 DIAGNOSIS — N170 Acute kidney failure with tubular necrosis: Secondary | ICD-10-CM | POA: Diagnosis present

## 2021-12-28 DIAGNOSIS — F1290 Cannabis use, unspecified, uncomplicated: Secondary | ICD-10-CM | POA: Diagnosis present

## 2021-12-28 DIAGNOSIS — J9584 Transfusion-related acute lung injury (TRALI): Secondary | ICD-10-CM | POA: Diagnosis not present

## 2021-12-28 DIAGNOSIS — I213 ST elevation (STEMI) myocardial infarction of unspecified site: Principal | ICD-10-CM | POA: Diagnosis present

## 2021-12-28 DIAGNOSIS — F32A Depression, unspecified: Secondary | ICD-10-CM | POA: Diagnosis present

## 2021-12-28 DIAGNOSIS — R34 Anuria and oliguria: Secondary | ICD-10-CM | POA: Diagnosis not present

## 2021-12-28 DIAGNOSIS — Y848 Other medical procedures as the cause of abnormal reaction of the patient, or of later complication, without mention of misadventure at the time of the procedure: Secondary | ICD-10-CM | POA: Diagnosis not present

## 2021-12-28 DIAGNOSIS — I71019 Dissection of thoracic aorta, unspecified: Secondary | ICD-10-CM | POA: Diagnosis present

## 2021-12-28 DIAGNOSIS — F419 Anxiety disorder, unspecified: Secondary | ICD-10-CM | POA: Diagnosis present

## 2021-12-28 HISTORY — PX: THORACIC AORTIC ANEURYSM REPAIR: SHX799

## 2021-12-28 HISTORY — PX: LEFT HEART CATH AND CORONARY ANGIOGRAPHY: CATH118249

## 2021-12-28 HISTORY — DX: Atherosclerotic heart disease of native coronary artery without angina pectoris: I25.10

## 2021-12-28 HISTORY — DX: Peripheral vascular disease, unspecified: I73.9

## 2021-12-28 HISTORY — PX: PLACEMENT OF IMPELLA LEFT VENTRICULAR ASSIST DEVICE: SHX6519

## 2021-12-28 HISTORY — DX: Essential (primary) hypertension: I10

## 2021-12-28 HISTORY — DX: Tobacco use: Z72.0

## 2021-12-28 LAB — POCT I-STAT, CHEM 8
BUN: 20 mg/dL (ref 6–20)
BUN: 20 mg/dL (ref 6–20)
BUN: 22 mg/dL — ABNORMAL HIGH (ref 6–20)
Calcium, Ion: 1.17 mmol/L (ref 1.15–1.40)
Calcium, Ion: 1.2 mmol/L (ref 1.15–1.40)
Calcium, Ion: 1.21 mmol/L (ref 1.15–1.40)
Chloride: 102 mmol/L (ref 98–111)
Chloride: 102 mmol/L (ref 98–111)
Chloride: 106 mmol/L (ref 98–111)
Creatinine, Ser: 1.6 mg/dL — ABNORMAL HIGH (ref 0.61–1.24)
Creatinine, Ser: 1.6 mg/dL — ABNORMAL HIGH (ref 0.61–1.24)
Creatinine, Ser: 1.8 mg/dL — ABNORMAL HIGH (ref 0.61–1.24)
Glucose, Bld: 153 mg/dL — ABNORMAL HIGH (ref 70–99)
Glucose, Bld: 251 mg/dL — ABNORMAL HIGH (ref 70–99)
Glucose, Bld: 297 mg/dL — ABNORMAL HIGH (ref 70–99)
HCT: 27 % — ABNORMAL LOW (ref 39.0–52.0)
HCT: 33 % — ABNORMAL LOW (ref 39.0–52.0)
HCT: 34 % — ABNORMAL LOW (ref 39.0–52.0)
Hemoglobin: 11.2 g/dL — ABNORMAL LOW (ref 13.0–17.0)
Hemoglobin: 11.6 g/dL — ABNORMAL LOW (ref 13.0–17.0)
Hemoglobin: 9.2 g/dL — ABNORMAL LOW (ref 13.0–17.0)
Potassium: 3.5 mmol/L (ref 3.5–5.1)
Potassium: 3.7 mmol/L (ref 3.5–5.1)
Potassium: 3.8 mmol/L (ref 3.5–5.1)
Sodium: 139 mmol/L (ref 135–145)
Sodium: 139 mmol/L (ref 135–145)
Sodium: 140 mmol/L (ref 135–145)
TCO2: 20 mmol/L — ABNORMAL LOW (ref 22–32)
TCO2: 21 mmol/L — ABNORMAL LOW (ref 22–32)
TCO2: 25 mmol/L (ref 22–32)

## 2021-12-28 LAB — APTT: aPTT: 86 seconds — ABNORMAL HIGH (ref 24–36)

## 2021-12-28 LAB — POCT I-STAT 7, (LYTES, BLD GAS, ICA,H+H)
Acid-base deficit: 1 mmol/L (ref 0.0–2.0)
Acid-base deficit: 5 mmol/L — ABNORMAL HIGH (ref 0.0–2.0)
Bicarbonate: 20.2 mmol/L (ref 20.0–28.0)
Bicarbonate: 24.7 mmol/L (ref 20.0–28.0)
Calcium, Ion: 1.02 mmol/L — ABNORMAL LOW (ref 1.15–1.40)
Calcium, Ion: 1.21 mmol/L (ref 1.15–1.40)
HCT: 20 % — ABNORMAL LOW (ref 39.0–52.0)
HCT: 32 % — ABNORMAL LOW (ref 39.0–52.0)
Hemoglobin: 10.9 g/dL — ABNORMAL LOW (ref 13.0–17.0)
Hemoglobin: 6.8 g/dL — CL (ref 13.0–17.0)
O2 Saturation: 100 %
O2 Saturation: 97 %
Potassium: 3.7 mmol/L (ref 3.5–5.1)
Potassium: 3.8 mmol/L (ref 3.5–5.1)
Sodium: 138 mmol/L (ref 135–145)
Sodium: 139 mmol/L (ref 135–145)
TCO2: 21 mmol/L — ABNORMAL LOW (ref 22–32)
TCO2: 26 mmol/L (ref 22–32)
pCO2 arterial: 37.8 mmHg (ref 32–48)
pCO2 arterial: 45.1 mmHg (ref 32–48)
pH, Arterial: 7.335 — ABNORMAL LOW (ref 7.35–7.45)
pH, Arterial: 7.347 — ABNORMAL LOW (ref 7.35–7.45)
pO2, Arterial: 421 mmHg — ABNORMAL HIGH (ref 83–108)
pO2, Arterial: 92 mmHg (ref 83–108)

## 2021-12-28 LAB — CBC WITH DIFFERENTIAL/PLATELET
Abs Immature Granulocytes: 0.03 10*3/uL (ref 0.00–0.07)
Basophils Absolute: 0.1 10*3/uL (ref 0.0–0.1)
Basophils Relative: 1 %
Eosinophils Absolute: 0.1 10*3/uL (ref 0.0–0.5)
Eosinophils Relative: 1 %
HCT: 36.1 % — ABNORMAL LOW (ref 39.0–52.0)
Hemoglobin: 11.9 g/dL — ABNORMAL LOW (ref 13.0–17.0)
Immature Granulocytes: 0 %
Lymphocytes Relative: 35 %
Lymphs Abs: 4.5 10*3/uL — ABNORMAL HIGH (ref 0.7–4.0)
MCH: 29.8 pg (ref 26.0–34.0)
MCHC: 33 g/dL (ref 30.0–36.0)
MCV: 90.3 fL (ref 80.0–100.0)
Monocytes Absolute: 0.9 10*3/uL (ref 0.1–1.0)
Monocytes Relative: 7 %
Neutro Abs: 7.2 10*3/uL (ref 1.7–7.7)
Neutrophils Relative %: 56 %
Platelets: 231 10*3/uL (ref 150–400)
RBC: 4 MIL/uL — ABNORMAL LOW (ref 4.22–5.81)
RDW: 13.8 % (ref 11.5–15.5)
WBC: 12.9 10*3/uL — ABNORMAL HIGH (ref 4.0–10.5)
nRBC: 0 % (ref 0.0–0.2)

## 2021-12-28 LAB — BASIC METABOLIC PANEL
Anion gap: 16 — ABNORMAL HIGH (ref 5–15)
BUN: 21 mg/dL — ABNORMAL HIGH (ref 6–20)
CO2: 17 mmol/L — ABNORMAL LOW (ref 22–32)
Calcium: 9.1 mg/dL (ref 8.9–10.3)
Chloride: 105 mmol/L (ref 98–111)
Creatinine, Ser: 1.94 mg/dL — ABNORMAL HIGH (ref 0.61–1.24)
GFR, Estimated: 44 mL/min — ABNORMAL LOW (ref >60)
Glucose, Bld: 230 mg/dL — ABNORMAL HIGH (ref 70–99)
Potassium: 2.9 mmol/L — ABNORMAL LOW (ref 3.5–5.1)
Sodium: 138 mmol/L (ref 135–145)

## 2021-12-28 LAB — LIPID PANEL
Cholesterol: 170 mg/dL (ref 0–200)
HDL: 48 mg/dL (ref >40)
LDL Cholesterol: 109 mg/dL — ABNORMAL HIGH (ref 0–99)
Total CHOL/HDL Ratio: 3.5 RATIO
Triglycerides: 64 mg/dL (ref <150)
VLDL: 13 mg/dL (ref 0–40)

## 2021-12-28 LAB — COMPREHENSIVE METABOLIC PANEL
ALT: 14 U/L (ref 0–44)
AST: 17 U/L (ref 15–41)
Albumin: 4.1 g/dL (ref 3.5–5.0)
Alkaline Phosphatase: 59 U/L (ref 38–126)
Anion gap: 10 (ref 5–15)
BUN: 23 mg/dL — ABNORMAL HIGH (ref 6–20)
CO2: 22 mmol/L (ref 22–32)
Calcium: 9.1 mg/dL (ref 8.9–10.3)
Chloride: 109 mmol/L (ref 98–111)
Creatinine, Ser: 1.89 mg/dL — ABNORMAL HIGH (ref 0.61–1.24)
GFR, Estimated: 45 mL/min — ABNORMAL LOW (ref >60)
Glucose, Bld: 119 mg/dL — ABNORMAL HIGH (ref 70–99)
Potassium: 3.7 mmol/L (ref 3.5–5.1)
Sodium: 141 mmol/L (ref 135–145)
Total Bilirubin: 1.1 mg/dL (ref 0.3–1.2)
Total Protein: 6.5 g/dL (ref 6.5–8.1)

## 2021-12-28 LAB — HEMOGLOBIN A1C
Hgb A1c MFr Bld: 5.4 % (ref 4.8–5.6)
Mean Plasma Glucose: 108.28 mg/dL

## 2021-12-28 LAB — ACETAMINOPHEN LEVEL: Acetaminophen (Tylenol), Serum: <10 ug/mL — ABNORMAL LOW (ref 10–30)

## 2021-12-28 LAB — RAPID URINE DRUG SCREEN, HOSP PERFORMED
Amphetamines: NOT DETECTED
Barbiturates: NOT DETECTED
Benzodiazepines: POSITIVE — AB
Cocaine: NOT DETECTED
Opiates: POSITIVE — AB
Tetrahydrocannabinol: POSITIVE — AB

## 2021-12-28 LAB — PROTIME-INR
INR: 1.1 (ref 0.8–1.2)
Prothrombin Time: 14.1 seconds (ref 11.4–15.2)

## 2021-12-28 LAB — TROPONIN I (HIGH SENSITIVITY)
Troponin I (High Sensitivity): 54 ng/L — ABNORMAL HIGH (ref <18)
Troponin I (High Sensitivity): 64 ng/L — ABNORMAL HIGH (ref <18)

## 2021-12-28 LAB — SALICYLATE LEVEL: Salicylate Lvl: <7 mg/dL — ABNORMAL LOW (ref 7.0–30.0)

## 2021-12-28 LAB — ETHANOL: Alcohol, Ethyl (B): <10 mg/dL (ref <10)

## 2021-12-28 LAB — PREPARE RBC (CROSSMATCH)

## 2021-12-28 LAB — ABO/RH: ABO/RH(D): B POS

## 2021-12-28 SURGERY — LEFT HEART CATH AND CORONARY ANGIOGRAPHY
Anesthesia: LOCAL

## 2021-12-28 SURGERY — REPAIR, ANEURYSM, AORTA, THORACIC, ASCENDING
Anesthesia: General | Site: Chest

## 2021-12-28 MED ORDER — HEPARIN BOLUS VIA INFUSION
4000.0000 [IU] | Freq: Once | INTRAVENOUS | Status: DC
  Filled 2021-12-28: qty 4000

## 2021-12-28 MED ORDER — PLASMA-LYTE A IV SOLN
INTRAVENOUS | Status: DC
  Filled 2021-12-28: qty 2.5

## 2021-12-28 MED ORDER — SODIUM CHLORIDE 0.9 % IV SOLN: INTRAVENOUS | Status: DC

## 2021-12-28 MED ORDER — SUCCINYLCHOLINE CHLORIDE 200 MG/10ML IV SOSY
PREFILLED_SYRINGE | INTRAVENOUS | Status: DC | PRN
  Administered 2021-12-28: 120 mg via INTRAVENOUS

## 2021-12-28 MED ORDER — FENTANYL CITRATE (PF) 250 MCG/5ML IJ SOLN
INTRAMUSCULAR | Status: DC | PRN
  Administered 2021-12-28: 100 ug via INTRAVENOUS
  Administered 2021-12-28: 150 ug via INTRAVENOUS
  Administered 2021-12-28: 250 ug via INTRAVENOUS
  Administered 2021-12-28: 150 ug via INTRAVENOUS
  Administered 2021-12-28: 100 ug via INTRAVENOUS
  Administered 2021-12-28 (×2): 25 ug via INTRAVENOUS
  Administered 2021-12-28 (×2): 100 ug via INTRAVENOUS
  Administered 2021-12-29: 250 ug via INTRAVENOUS

## 2021-12-28 MED ORDER — FENTANYL CITRATE (PF) 250 MCG/5ML IJ SOLN
INTRAMUSCULAR | Status: AC
  Filled 2021-12-28: qty 5

## 2021-12-28 MED ORDER — MORPHINE SULFATE (PF) 2 MG/ML IV SOLN
2.0000 mg | INTRAVENOUS | Status: DC | PRN
  Administered 2021-12-28: 2 mg via INTRAVENOUS
  Filled 2021-12-28 (×2): qty 1

## 2021-12-28 MED ORDER — NITROGLYCERIN IN D5W 200-5 MCG/ML-% IV SOLN
INTRAVENOUS | Status: AC | PRN
  Administered 2021-12-28: 20 ug/min via INTRAVENOUS

## 2021-12-28 MED ORDER — LIDOCAINE HCL (PF) 1 % IJ SOLN
INTRAMUSCULAR | Status: DC | PRN
  Administered 2021-12-28: 2 mL

## 2021-12-28 MED ORDER — TICAGRELOR 90 MG PO TABS
ORAL_TABLET | ORAL | Status: DC | PRN
  Administered 2021-12-28: 180 mg via ORAL

## 2021-12-28 MED ORDER — HEPARIN SODIUM (PORCINE) 1000 UNIT/ML IJ SOLN
INTRAMUSCULAR | Status: DC | PRN
  Administered 2021-12-28: 4000 [IU] via INTRAVENOUS

## 2021-12-28 MED ORDER — IOHEXOL 350 MG/ML SOLN
INTRAVENOUS | Status: DC | PRN
  Administered 2021-12-28: 35 mL

## 2021-12-28 MED ORDER — CHLORHEXIDINE GLUCONATE 0.12 % MT SOLN: 15.0000 mL | Freq: Once | OROMUCOSAL | Status: DC

## 2021-12-28 MED ORDER — HEPARIN SODIUM (PORCINE) 1000 UNIT/ML IJ SOLN
INTRAMUSCULAR | Status: AC
  Filled 2021-12-28: qty 10

## 2021-12-28 MED ORDER — 0.9 % SODIUM CHLORIDE (POUR BTL) OPTIME
TOPICAL | Status: DC | PRN
  Administered 2021-12-28: 1000 mL

## 2021-12-28 MED ORDER — ACETAMINOPHEN 325 MG PO TABS: 650.0000 mg | ORAL_TABLET | ORAL | Status: DC | PRN

## 2021-12-28 MED ORDER — INSULIN REGULAR(HUMAN) IN NACL 100-0.9 UT/100ML-% IV SOLN
INTRAVENOUS | Status: AC
  Administered 2021-12-28: 1 [IU]/h via INTRAVENOUS
  Filled 2021-12-28: qty 100

## 2021-12-28 MED ORDER — NOREPINEPHRINE 4 MG/250ML-% IV SOLN
0.0000 ug/min | INTRAVENOUS | Status: AC
  Administered 2021-12-29: 8 ug/min via INTRAVENOUS
  Filled 2021-12-28: qty 250

## 2021-12-28 MED ORDER — NITROGLYCERIN IN D5W 200-5 MCG/ML-% IV SOLN
2.0000 ug/min | INTRAVENOUS | Status: DC
  Filled 2021-12-28: qty 250

## 2021-12-28 MED ORDER — HEPARIN SODIUM (PORCINE) 5000 UNIT/ML IJ SOLN: 4000.0000 [IU] | Freq: Once | INTRAMUSCULAR | Status: DC

## 2021-12-28 MED ORDER — MIDAZOLAM HCL 2 MG/2ML IJ SOLN
1.0000 mg | INTRAMUSCULAR | Status: DC | PRN
  Filled 2021-12-28 (×2): qty 2

## 2021-12-28 MED ORDER — MIDAZOLAM HCL 5 MG/5ML IJ SOLN
INTRAMUSCULAR | Status: DC | PRN
  Administered 2021-12-28 (×2): 1 mg via INTRAVENOUS
  Administered 2021-12-28: 3 mg via INTRAVENOUS
  Administered 2021-12-29: 2 mg via INTRAVENOUS
  Administered 2021-12-29: 5 mg via INTRAVENOUS

## 2021-12-28 MED ORDER — SODIUM CHLORIDE 0.9 % IV SOLN
250.0000 mL | INTRAVENOUS | Status: DC | PRN
Start: 2021-12-28 — End: 2021-12-29

## 2021-12-28 MED ORDER — VERAPAMIL HCL 2.5 MG/ML IV SOLN
INTRAVENOUS | Status: DC | PRN
  Administered 2021-12-28: 10 mL via INTRA_ARTERIAL

## 2021-12-28 MED ORDER — HEPARIN SODIUM (PORCINE) 5000 UNIT/ML IJ SOLN
4000.0000 [IU] | Freq: Three times a day (TID) | INTRAMUSCULAR | Status: DC
  Filled 2021-12-28: qty 0.8

## 2021-12-28 MED ORDER — LABETALOL HCL 5 MG/ML IV SOLN
INTRAVENOUS | Status: AC
  Filled 2021-12-28: qty 4

## 2021-12-28 MED ORDER — HEPARIN SODIUM (PORCINE) 1000 UNIT/ML IJ SOLN
INTRAMUSCULAR | Status: DC | PRN
  Administered 2021-12-28: 23000 [IU] via INTRAVENOUS
  Administered 2021-12-28: 4000 [IU] via INTRAVENOUS

## 2021-12-28 MED ORDER — TICAGRELOR 90 MG PO TABS
ORAL_TABLET | ORAL | Status: AC
  Filled 2021-12-28: qty 2

## 2021-12-28 MED ORDER — CEFAZOLIN SODIUM-DEXTROSE 2-4 GM/100ML-% IV SOLN
2.0000 g | INTRAVENOUS | Status: AC
  Administered 2021-12-29: 2 g via INTRAVENOUS
  Filled 2021-12-28: qty 100

## 2021-12-28 MED ORDER — MIDAZOLAM HCL 2 MG/2ML IJ SOLN
INTRAMUSCULAR | Status: AC
  Filled 2021-12-28: qty 2

## 2021-12-28 MED ORDER — SODIUM CHLORIDE 0.9% FLUSH: 3.0000 mL | Freq: Two times a day (BID) | INTRAVENOUS | Status: DC

## 2021-12-28 MED ORDER — CLOPIDOGREL BISULFATE 75 MG PO TABS: 75.0000 mg | ORAL_TABLET | Freq: Every day | ORAL | Status: DC

## 2021-12-28 MED ORDER — CEFAZOLIN SODIUM-DEXTROSE 2-4 GM/100ML-% IV SOLN
2.0000 g | INTRAVENOUS | Status: AC
  Administered 2021-12-28: 2 g via INTRAVENOUS
  Filled 2021-12-28: qty 100

## 2021-12-28 MED ORDER — PROPOFOL 10 MG/ML IV BOLUS
INTRAVENOUS | Status: AC
  Filled 2021-12-28: qty 20

## 2021-12-28 MED ORDER — NITROGLYCERIN 1 MG/10 ML FOR IR/CATH LAB
INTRA_ARTERIAL | Status: AC
  Filled 2021-12-28: qty 10

## 2021-12-28 MED ORDER — HEMOSTATIC AGENTS (NO CHARGE) OPTIME
TOPICAL | Status: DC | PRN
  Administered 2021-12-28: 1 via TOPICAL

## 2021-12-28 MED ORDER — MIDAZOLAM HCL (PF) 10 MG/2ML IJ SOLN
INTRAMUSCULAR | Status: AC
  Filled 2021-12-28: qty 2

## 2021-12-28 MED ORDER — TRANEXAMIC ACID (OHS) PUMP PRIME SOLUTION
2.0000 mg/kg | INTRAVENOUS | Status: DC
  Filled 2021-12-28: qty 1.68

## 2021-12-28 MED ORDER — TEMAZEPAM 15 MG PO CAPS: 15.0000 mg | ORAL_CAPSULE | Freq: Once | ORAL | Status: DC | PRN

## 2021-12-28 MED ORDER — CHLORHEXIDINE GLUCONATE CLOTH 2 % EX PADS
6.0000 | MEDICATED_PAD | Freq: Once | CUTANEOUS | Status: AC
  Administered 2021-12-28: 6 via TOPICAL

## 2021-12-28 MED ORDER — HEPARIN 30,000 UNITS/1000 ML (OHS) CELLSAVER SOLUTION
Status: DC
  Filled 2021-12-28: qty 1000

## 2021-12-28 MED ORDER — ROCURONIUM BROMIDE 10 MG/ML (PF) SYRINGE
PREFILLED_SYRINGE | INTRAVENOUS | Status: DC | PRN
  Administered 2021-12-28 – 2021-12-29 (×3): 100 mg via INTRAVENOUS
  Administered 2021-12-29: 50 mg via INTRAVENOUS

## 2021-12-28 MED ORDER — BISACODYL 5 MG PO TBEC: 5.0000 mg | DELAYED_RELEASE_TABLET | Freq: Once | ORAL | Status: DC

## 2021-12-28 MED ORDER — MIDAZOLAM HCL 2 MG/2ML IJ SOLN
INTRAMUSCULAR | Status: DC | PRN
  Administered 2021-12-28: 1 mg via INTRAVENOUS

## 2021-12-28 MED ORDER — FENTANYL CITRATE (PF) 100 MCG/2ML IJ SOLN
INTRAMUSCULAR | Status: AC
  Filled 2021-12-28: qty 2

## 2021-12-28 MED ORDER — VERAPAMIL HCL 2.5 MG/ML IV SOLN
INTRAVENOUS | Status: AC
  Filled 2021-12-28: qty 2

## 2021-12-28 MED ORDER — POTASSIUM CHLORIDE 2 MEQ/ML IV SOLN
80.0000 meq | INTRAVENOUS | Status: DC
  Filled 2021-12-28: qty 40

## 2021-12-28 MED ORDER — LACTATED RINGERS IV SOLN: INTRAVENOUS | Status: DC | PRN

## 2021-12-28 MED ORDER — HYDRALAZINE HCL 20 MG/ML IJ SOLN: 10.0000 mg | INTRAMUSCULAR | Status: AC | PRN

## 2021-12-28 MED ORDER — MILRINONE LACTATE IN DEXTROSE 20-5 MG/100ML-% IV SOLN
0.3000 ug/kg/min | INTRAVENOUS | Status: AC
  Administered 2021-12-29: .5 ug/kg/min via INTRAVENOUS
  Filled 2021-12-28: qty 100

## 2021-12-28 MED ORDER — DEXMEDETOMIDINE HCL IN NACL 400 MCG/100ML IV SOLN
0.1000 ug/kg/h | INTRAVENOUS | Status: AC
  Administered 2021-12-28: .3 ug/kg/h via INTRAVENOUS
  Filled 2021-12-28: qty 100

## 2021-12-28 MED ORDER — MANNITOL 20 % IV SOLN
INTRAVENOUS | Status: DC
  Filled 2021-12-28: qty 13

## 2021-12-28 MED ORDER — VANCOMYCIN HCL 1250 MG/250ML IV SOLN
1250.0000 mg | INTRAVENOUS | Status: DC
  Filled 2021-12-28: qty 250

## 2021-12-28 MED ORDER — SODIUM CHLORIDE 0.9% FLUSH
3.0000 mL | INTRAVENOUS | Status: DC | PRN
Start: 2021-12-28 — End: 2021-12-29

## 2021-12-28 MED ORDER — METOPROLOL TARTRATE 12.5 MG HALF TABLET: 12.5000 mg | ORAL_TABLET | Freq: Once | ORAL | Status: DC

## 2021-12-28 MED ORDER — LABETALOL HCL 5 MG/ML IV SOLN
0.5000 mg/min | Status: DC
  Administered 2021-12-28: 0.5 mg/min via INTRAVENOUS
  Filled 2021-12-28 (×2): qty 80

## 2021-12-28 MED ORDER — FENTANYL CITRATE (PF) 100 MCG/2ML IJ SOLN
INTRAMUSCULAR | Status: DC | PRN
  Administered 2021-12-28: 25 ug via INTRAVENOUS

## 2021-12-28 MED ORDER — MAGNESIUM SULFATE 50 % IJ SOLN
40.0000 meq | INTRAMUSCULAR | Status: DC
  Filled 2021-12-28: qty 9.85

## 2021-12-28 MED ORDER — ROSUVASTATIN CALCIUM 20 MG PO TABS: 40.0000 mg | ORAL_TABLET | Freq: Every day | ORAL | Status: DC

## 2021-12-28 MED ORDER — FENTANYL CITRATE PF 50 MCG/ML IJ SOSY
12.5000 ug | PREFILLED_SYRINGE | Freq: Once | INTRAMUSCULAR | Status: DC
  Filled 2021-12-28: qty 1

## 2021-12-28 MED ORDER — LIDOCAINE HCL (PF) 1 % IJ SOLN
INTRAMUSCULAR | Status: AC
  Filled 2021-12-28: qty 30

## 2021-12-28 MED ORDER — IOHEXOL 350 MG/ML SOLN
100.0000 mL | Freq: Once | INTRAVENOUS | Status: AC | PRN
  Administered 2021-12-28: 100 mL via INTRAVENOUS

## 2021-12-28 MED ORDER — LABETALOL HCL 5 MG/ML IV SOLN: 10.0000 mg | INTRAVENOUS | Status: DC | PRN

## 2021-12-28 MED ORDER — NITROGLYCERIN IN D5W 200-5 MCG/ML-% IV SOLN: 0.0000 ug/min | INTRAVENOUS | Status: DC

## 2021-12-28 MED ORDER — MORPHINE SULFATE (PF) 2 MG/ML IV SOLN
2.0000 mg | INTRAVENOUS | Status: DC | PRN
  Administered 2021-12-28 (×2): 2 mg via INTRAVENOUS
  Filled 2021-12-28 (×2): qty 1

## 2021-12-28 MED ORDER — TRANEXAMIC ACID (OHS) BOLUS VIA INFUSION
15.0000 mg/kg | INTRAVENOUS | Status: AC
  Administered 2021-12-28: 1258.5 mg via INTRAVENOUS
  Filled 2021-12-28: qty 1259

## 2021-12-28 MED ORDER — PHENYLEPHRINE HCL-NACL 20-0.9 MG/250ML-% IV SOLN
30.0000 ug/min | INTRAVENOUS | Status: DC
  Filled 2021-12-28: qty 250

## 2021-12-28 MED ORDER — SODIUM BICARBONATE 8.4 % IV SOLN
INTRAVENOUS | Status: DC | PRN
  Administered 2021-12-28: 50 meq via INTRAVENOUS

## 2021-12-28 MED ORDER — HEPARIN SODIUM (PORCINE) 5000 UNIT/ML IJ SOLN
4000.0000 [IU] | Freq: Once | INTRAMUSCULAR | Status: AC
  Administered 2021-12-28: 4000 [IU] via INTRAVENOUS
  Filled 2021-12-28: qty 0.8

## 2021-12-28 MED ORDER — ONDANSETRON HCL 4 MG/2ML IJ SOLN
4.0000 mg | Freq: Four times a day (QID) | INTRAMUSCULAR | Status: DC | PRN
  Administered 2021-12-28: 4 mg via INTRAVENOUS
  Filled 2021-12-28 (×2): qty 2

## 2021-12-28 MED ORDER — EPINEPHRINE HCL 5 MG/250ML IV SOLN IN NS
0.0000 ug/min | INTRAVENOUS | Status: AC
  Administered 2021-12-29: 2 ug/min via INTRAVENOUS
  Filled 2021-12-28: qty 250

## 2021-12-28 MED ORDER — TRANEXAMIC ACID 1000 MG/10ML IV SOLN
1.5000 mg/kg/h | INTRAVENOUS | Status: AC
  Administered 2021-12-28: 1.5 mg/kg/h via INTRAVENOUS
  Filled 2021-12-28: qty 25

## 2021-12-28 MED ORDER — ETOMIDATE 2 MG/ML IV SOLN
INTRAVENOUS | Status: DC | PRN
  Administered 2021-12-28: 12 mg via INTRAVENOUS
  Administered 2021-12-29: 8 mg via INTRAVENOUS

## 2021-12-28 MED ORDER — HEPARIN (PORCINE) IN NACL 1000-0.9 UT/500ML-% IV SOLN
INTRAVENOUS | Status: DC | PRN
  Administered 2021-12-28 (×2): 500 mL

## 2021-12-28 MED ORDER — HEPARIN (PORCINE) 25000 UT/250ML-% IV SOLN
1400.0000 [IU]/h | INTRAVENOUS | Status: DC
  Administered 2021-12-28: 1400 [IU]/h via INTRAVENOUS
  Filled 2021-12-28: qty 250

## 2021-12-28 MED ORDER — SODIUM CHLORIDE 0.9 % IV SOLN
INTRAVENOUS | Status: AC | PRN
  Administered 2021-12-28: 10 mL/h via INTRAVENOUS

## 2021-12-28 MED ORDER — PLASMA-LYTE A IV SOLN: INTRAVENOUS | Status: DC | PRN

## 2021-12-28 MED ORDER — FUROSEMIDE 10 MG/ML IJ SOLN: 40.0000 mg | Freq: Two times a day (BID) | INTRAMUSCULAR | Status: DC

## 2021-12-28 MED ORDER — ASPIRIN 81 MG PO TBEC: 81.0000 mg | DELAYED_RELEASE_TABLET | Freq: Every day | ORAL | Status: DC

## 2021-12-28 SURGICAL SUPPLY — 113 items
ADAPTER CARDIO PERF ANTE/RETRO (ADAPTER) ×4 IMPLANT
ADH SRG 12 PREFL SYR 3 SPRDR (MISCELLANEOUS) ×6
ADPR PRFSN 84XANTGRD RTRGD (ADAPTER) ×2
APL SRG 7X2 LUM MLBL SLNT (VASCULAR PRODUCTS)
APPLICATOR TIP COSEAL (VASCULAR PRODUCTS) IMPLANT
BAG DECANTER FOR FLEXI CONT (MISCELLANEOUS) ×4 IMPLANT
BLADE CLIPPER SURG (BLADE) ×5 IMPLANT
BLADE STERNUM SYSTEM 6 (BLADE) IMPLANT
BLADE SURG 15 STRL LF DISP TIS (BLADE) ×3 IMPLANT
BLADE SURG 15 STRL SS (BLADE) ×3
CANISTER SUCT 3000ML PPV (MISCELLANEOUS) ×4 IMPLANT
CANNULA EZ GLIDE AORTIC 21FR (CANNULA) ×1 IMPLANT
CANNULA GUNDRY RCSP 15FR (MISCELLANEOUS) ×9 IMPLANT
CANNULA SUMP PERICARDIAL (CANNULA) ×1 IMPLANT
CATH DIAG EXPO 6F FR4 (CATHETERS) ×1 IMPLANT
CATH HEART VENT LEFT (CATHETERS) ×3 IMPLANT
CATH THORACIC 28FR (CATHETERS) IMPLANT
CATH THORACIC 28FR RT ANG (CATHETERS) ×4 IMPLANT
CATH/SQUID NICHOLS JEHLE COR (CATHETERS) ×5 IMPLANT
CAUTERY EYE LOW TEMP 1300F FIN (OPHTHALMIC RELATED) ×4 IMPLANT
CAUTERY SURG HI TEMP FINE TIP (MISCELLANEOUS) ×1 IMPLANT
CNTNR URN SCR LID CUP LEK RST (MISCELLANEOUS) IMPLANT
CONN Y 3/8X3/8X3/8  BEN (MISCELLANEOUS) ×3
CONN Y 3/8X3/8X3/8 BEN (MISCELLANEOUS) ×3 IMPLANT
CONT SPEC 4OZ STRL OR WHT (MISCELLANEOUS) ×6
CONTAINER PROTECT SURGISLUSH (MISCELLANEOUS) ×8 IMPLANT
COVER SURGICAL LIGHT HANDLE (MISCELLANEOUS) ×4 IMPLANT
DRSG AQUACEL AG ADV 3.5X 6 (GAUZE/BANDAGES/DRESSINGS) ×1 IMPLANT
DRSG AQUACEL AG ADV 3.5X14 (GAUZE/BANDAGES/DRESSINGS) ×4 IMPLANT
DRSG TEGADERM 4X4.75 (GAUZE/BANDAGES/DRESSINGS) ×1 IMPLANT
ELECT REM PT RETURN 9FT ADLT (ELECTROSURGICAL) ×6
ELECTRODE REM PT RTRN 9FT ADLT (ELECTROSURGICAL) ×6 IMPLANT
FELT TEFLON 6X6 (MISCELLANEOUS) ×4 IMPLANT
GAUZE 4X4 16PLY ~~LOC~~+RFID DBL (SPONGE) ×4 IMPLANT
GAUZE SPONGE 4X4 12PLY STRL (GAUZE/BANDAGES/DRESSINGS) ×8 IMPLANT
GAUZE SPONGE 4X4 12PLY STRL LF (GAUZE/BANDAGES/DRESSINGS) ×1 IMPLANT
GLOVE BIO SURGEON STRL SZ 6 (GLOVE) ×1 IMPLANT
GLOVE BIO SURGEON STRL SZ 6.5 (GLOVE) IMPLANT
GLOVE BIO SURGEON STRL SZ7 (GLOVE) ×1 IMPLANT
GLOVE BIO SURGEON STRL SZ7.5 (GLOVE) ×19 IMPLANT
GLOVE BIO SURGEON STRL SZ8.5 (GLOVE) ×1 IMPLANT
GLOVE SS BIOGEL STRL SZ 7.5 (GLOVE) IMPLANT
GLOVE SUPERSENSE BIOGEL SZ 7.5 (GLOVE) ×1
GOWN STRL REUS W/ TWL LRG LVL3 (GOWN DISPOSABLE) ×12 IMPLANT
GOWN STRL REUS W/TWL LRG LVL3 (GOWN DISPOSABLE) ×12
GRAFT GELWEAVE IMPREG 8X30CM (Prosthesis & Implant Heart) ×1 IMPLANT
GRAFT HEMASHIELD 10MM (Graft) ×3 IMPLANT
GRAFT VASC STRG 30X10STRL (Graft) IMPLANT
GRAFT WOVEN D/V 28DX30L (Vascular Products) ×1 IMPLANT
HEMOSTAT POWDER SURGIFOAM 1G (HEMOSTASIS) ×15 IMPLANT
HEMOSTAT SURGICEL 2X14 (HEMOSTASIS) ×4 IMPLANT
INSERT FOGARTY SM (MISCELLANEOUS) ×2 IMPLANT
INSERT FOGARTY XLG (MISCELLANEOUS) ×1 IMPLANT
KIT BASIN OR (CUSTOM PROCEDURE TRAY) ×4 IMPLANT
KIT SUCTION CATH 14FR (SUCTIONS) ×4 IMPLANT
KIT TURNOVER KIT B (KITS) ×4 IMPLANT
LEAD PACING MYOCARDI (MISCELLANEOUS) ×1 IMPLANT
LINE VENT (MISCELLANEOUS) ×3 IMPLANT
NDL AORTIC AIR ASPIRATING (NEEDLE) IMPLANT
NDL SUT 4 .5 CRC FRENCH EYE (NEEDLE) IMPLANT
NEEDLE AORTIC AIR ASPIRATING (NEEDLE) IMPLANT
NEEDLE FRENCH EYE (NEEDLE) ×3
NS IRRIG 1000ML POUR BTL (IV SOLUTION) ×21 IMPLANT
PACK E OPEN HEART (SUTURE) ×4 IMPLANT
PACK OPEN HEART (CUSTOM PROCEDURE TRAY) ×4 IMPLANT
PAD ARMBOARD 7.5X6 YLW CONV (MISCELLANEOUS) ×8 IMPLANT
PAD DEFIB R2 (MISCELLANEOUS) ×4 IMPLANT
POSITIONER HEAD DONUT 9IN (MISCELLANEOUS) IMPLANT
PUMP SET IMPELLA 5.5 US (CATHETERS) ×1 IMPLANT
RELOAD STAPLE 30 PURP MED/THCK (STAPLE) IMPLANT
RELOAD TRI 2.0 30 MED THCK SUL (STAPLE) ×3 IMPLANT
SEALANT SURG COSEAL 8ML (VASCULAR PRODUCTS) ×1 IMPLANT
SET MPS 3-ND DEL (MISCELLANEOUS) ×1 IMPLANT
SPONGE T-LAP 18X18 ~~LOC~~+RFID (SPONGE) ×16 IMPLANT
SPONGE T-LAP 4X18 ~~LOC~~+RFID (SPONGE) ×5 IMPLANT
STAPLER ENDO GIA 12 SHRT THIN (STAPLE) IMPLANT
STAPLER ENDO GIA 12MM SHORT (STAPLE) ×3 IMPLANT
STAPLER VISISTAT 35W (STAPLE) ×1 IMPLANT
STOPCOCK 4 WAY LG BORE MALE ST (IV SETS) ×4 IMPLANT
SUT PROLENE 3 0 RB 1 (SUTURE) ×6 IMPLANT
SUT PROLENE 3 0 SH DA (SUTURE) ×8 IMPLANT
SUT PROLENE 4 0 RB 1 (SUTURE) ×96
SUT PROLENE 4 0 SH DA (SUTURE) ×4 IMPLANT
SUT PROLENE 4-0 RB1 .5 CRCL 36 (SUTURE) IMPLANT
SUT PROLENE 5 0 C 1 36 (SUTURE) ×11 IMPLANT
SUT PROLENE 6 0 C 1 30 (SUTURE) ×8 IMPLANT
SUT PROLENE 6 0 CC (SUTURE) ×5 IMPLANT
SUT SILK 2 0SH CR/8 30 (SUTURE) ×1 IMPLANT
SUT STEEL 6MS V (SUTURE) ×1 IMPLANT
SUT STEEL SZ 6 DBL 3X14 BALL (SUTURE) ×2 IMPLANT
SUT VIC AB 1 CT1 18XCR BRD 8 (SUTURE) IMPLANT
SUT VIC AB 1 CT1 8-18 (SUTURE)
SUT VIC AB 1 CTX 18 (SUTURE) ×2 IMPLANT
SUT VIC AB 1 CTX 27 (SUTURE) ×8 IMPLANT
SUT VIC AB 2-0 CT1 27 (SUTURE)
SUT VIC AB 2-0 CT1 TAPERPNT 27 (SUTURE) IMPLANT
SUT VIC AB 2-0 CTX 36 (SUTURE) IMPLANT
SUT VIC AB 3-0 SH 27 (SUTURE)
SUT VIC AB 3-0 SH 27X BRD (SUTURE) IMPLANT
SUT VIC AB 3-0 X1 27 (SUTURE) IMPLANT
SUT VICRYL 4-0 PS2 18IN ABS (SUTURE) IMPLANT
SYR 10ML KIT SKIN ADHESIVE (MISCELLANEOUS) ×6 IMPLANT
SYSTEM SAHARA CHEST DRAIN ATS (WOUND CARE) ×4 IMPLANT
TAPE CLOTH SURG 4X10 WHT LF (GAUZE/BANDAGES/DRESSINGS) ×1 IMPLANT
TOWEL GREEN STERILE (TOWEL DISPOSABLE) ×4 IMPLANT
TOWEL GREEN STERILE FF (TOWEL DISPOSABLE) ×4 IMPLANT
TRAY CATH LUMEN 1 20CM STRL (SET/KITS/TRAYS/PACK) ×4 IMPLANT
TUBING ART PRESS 48 MALE/FEM (TUBING) ×8 IMPLANT
TUBING BULK SUCTION (MISCELLANEOUS) ×1 IMPLANT
VENT LEFT HEART 12002 (CATHETERS) ×3
WATER STERILE IRR 1000ML POUR (IV SOLUTION) ×8 IMPLANT
WIRE EMERALD 3MM-J .035X260CM (WIRE) ×1 IMPLANT
YANKAUER SUCT BULB TIP NO VENT (SUCTIONS) ×1 IMPLANT

## 2021-12-28 SURGICAL SUPPLY — 13 items
CATH INFINITI 5 FR JL3.5 (CATHETERS) ×1 IMPLANT
CATH INFINITI 5FR AL1 (CATHETERS) ×1 IMPLANT
CATH INFINITI JR4 5F (CATHETERS) ×1 IMPLANT
GLIDESHEATH SLEND SS 6F .021 (SHEATH) ×1 IMPLANT
GUIDEWIRE INQWIRE 1.5J.035X260 (WIRE) IMPLANT
INQWIRE 1.5J .035X260CM (WIRE) ×2
KIT ENCORE 26 ADVANTAGE (KITS) ×1 IMPLANT
KIT HEART LEFT (KITS) ×3 IMPLANT
KIT HEMO VALVE WATCHDOG (MISCELLANEOUS) ×1 IMPLANT
PACK CARDIAC CATHETERIZATION (CUSTOM PROCEDURE TRAY) ×3 IMPLANT
SHEATH PROBE COVER 6X72 (BAG) ×1 IMPLANT
TRANSDUCER W/STOPCOCK (MISCELLANEOUS) ×3 IMPLANT
TUBING CIL FLEX 10 FLL-RA (TUBING) ×3 IMPLANT

## 2021-12-28 NOTE — ED Notes (Signed)
Pt refused covid swab, stated will not go to cath lab if swab needed.

## 2021-12-28 NOTE — Anesthesia Procedure Notes (Signed)
Procedure Name: Intubation Date/Time: 12/16/2021 9:11 PM  Performed by: Daniele Dillow T, CRNAPre-anesthesia Checklist: Patient identified, Emergency Drugs available, Suction available and Patient being monitored Patient Re-evaluated:Patient Re-evaluated prior to induction Oxygen Delivery Method: Circle system utilized Preoxygenation: Pre-oxygenation with 100% oxygen Induction Type: IV induction, Rapid sequence and Cricoid Pressure applied Ventilation: Mask ventilation without difficulty Laryngoscope Size: Mac and 4 Grade View: Grade I Tube type: Oral Tube size: 8.0 mm Number of attempts: 1 Airway Equipment and Method: Stylet Placement Confirmation: ETT inserted through vocal cords under direct vision, positive ETCO2 and breath sounds checked- equal and bilateral Secured at: 23 cm Tube secured with: Tape Dental Injury: Teeth and Oropharynx as per pre-operative assessment

## 2021-12-28 NOTE — Progress Notes (Addendum)
Paged by nurse regarding worsening chest pain, ordered IV morphine.  Chest pain improved from 10 out of 10 to 7 out of 10.  On examination, patient continued to be significantly diaphoretic and nauseated and had vomiting.  I discussed the case with Dr. Eldridge Dace, no aortogram was performed during cardiac catheterization.  The patient has equal bilateral radial artery pulse, I do not think we cannot completely rule out dissection at this time.  Differential diagnosis include aortic dissection, drug/alcohol withdrawal, or psychiatric issue.  I discussed case with Dr. Allyson Sabal, given the severe chest and abdominal pain, even with a creatinine of 1.8, we still need to rule out dissection urgently.  Will order CTA to rule out dissection.  Pending urine drug test.  Ordered acetaminophen, salicylate, and ethanol level.  Dr. Allyson Sabal recommended hospitalist consult.   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   Pain control, BP control with IV nitro and IV labetalol.   Discussed with Dr. Allyson Sabal and updated Dr. Wyline Mood. Patient was loaded with Brilinta in the cath lab, however he likely has spitted out most of it.   Ramond Dial PA Pager: [phone]   Attending note Agree with documentation above by PA Lisabeth Devoid, initially patient signed out to me by Dr Allyson Sabal. Acute type A dissection, CT surgery has been immediately consulted. Started on IV labatelol drip as SBPs in 200s. Additional management per CT surgery Dr Donata Clay.    Dina Rich DM

## 2021-12-28 NOTE — Progress Notes (Signed)
PA Meng paged to bedside for chest pain not relieved by IV morphine.  Pt diaphoretic, screaming, complaints of 10/10 abdominal and back pain.  SBP 200's not resolved w/ nitro gtt.  Meng at bedside promptly w/ orders for stat CT.  This author and 2 RN's took pt to CT without issue.  Pt back in room and Cardiology paged and at bedside.

## 2021-12-28 NOTE — Anesthesia Procedure Notes (Signed)
Central Venous Catheter Insertion Start/End06/15/2023 8:40 PM, 01/09/2022 8:55 PM Patient location: Pre-op. Preanesthetic checklist: patient identified, IV checked, site marked, risks and benefits discussed, surgical consent, monitors and equipment checked, pre-op evaluation, timeout performed and anesthesia consent Hand hygiene performed  and maximum sterile barriers used  PA cath was placed.Swan type:thermodilution PA Cath depth:55 Procedure performed without using ultrasound guided technique. Attempts: 1 Patient tolerated the procedure well with no immediate complications.

## 2021-12-28 NOTE — Progress Notes (Signed)
Brought patient to OR per stretcher.On 2 L Superior. Alert and oriented. On Labetalol and Nitro drip infusing.Report given to CRNA.

## 2021-12-28 NOTE — H&P (Addendum)
Cardiology Admission History and Physical:   Patient ID: [name] MRN: [id number]; DOB: 1981/06/11   Admission date: 01/07/2022  PCP:  Patient, No Pcp Per   [place] Providers Cardiologist:  ? Saw [facility] last month, has had prior cardiologist at [facility] ?, saw Dr. Einar Mcguire in [id number] Click here to update MD or APP on Care Team, Refresh:1}     Chief Complaint:  chest pain  Patient Profile:   [name] is a 41 y.o. male with CAD with unclear details, PVD, tobacco abuse, HTN who is being seen 12/17/2021 for the evaluation of chest pain.  History of Present Illness:   [name] reports a history of MI in [place] 20 years ago, with another stent 2 years ago, and another stent at [facility] last month. However, there are no records in [place] linked to his current name. He does state that his first name is hyphenated [name] though there are no records that show up by that name either that we have access to currently. Otherwise denies any other aliases. Also says he may have seen [facility] in the past. In 2017 he had PV angio in our system by Dr. Einar Mcguire showing occluded R CIA s/p stenting, otherwise with mild atherosclerotic changes in the abdominal aorta. Dr. Irven Mcguire H/P at that time also references anxiety, depression, ED, and hyperglycemia at that time. He reports he had a stent at [place] [facility] 1 month ago but hasn't been taking any medications since that time because he signed himself out against medical advise. EMS was called out to his home today because of chest pain. They met him in the lobby of his mother's apartment complex. Per EMS report he was short with them when providing history so history was obtained talking to his mother, who had reported onset of severe substernal chest pain. This started about 35 minutes prior to hospital arrival. Patient reports ongoing 11/10 substernal chest pain described as squeezing similar to his initial MI 20 years ago. He got $Remo'324mg'OdJPZ$  ASA, 2 SL NTG, and 100cc of IV  fluids without relief of pain. He also has felt short of breath. Reports a chronic cough but otherwise reports being in his USOH lately. He does not have access presently to a portal or MRN from his [place] records. Initial EKG showed NSR 84bpm, leftward axis, ST elevation/early repolarization pattern in V1-V6 without prior to compare to, TWI I, avL, V6. Code STEMI was called given abnormal EKG and severity of patient's symptoms. He is agreeable to proceeding to emergent cath.   Past Medical History:  Diagnosis Date   CAD (coronary artery disease)    Heart attack ([place])    Hypertension    PVD (peripheral vascular disease) ([place])    Tobacco abuse     Past Surgical History:  Procedure Laterality Date   PERIPHERAL VASCULAR CATHETERIZATION N/A 07/18/2015   Procedure: Abdominal Aortogram;  Surgeon: Gary Prows, MD;  Location: [place] [facility];  Service: Cardiovascular;  Laterality: N/A;   PERIPHERAL VASCULAR CATHETERIZATION  07/18/2015   Procedure: Lower Extremity Angiography;  Surgeon: Gary Prows, MD;  Location: [place] [facility];  Service: Cardiovascular;;   PERIPHERAL VASCULAR CATHETERIZATION  07/18/2015   Procedure: Peripheral Vascular Intervention;  Surgeon: Gary Prows, MD;  Location: [place] [facility];  Service: Cardiovascular;;  RIGHT COMMON ILIAC     Medications Prior to Admission: Prior to Admission medications   Medication Sig Start Date End Date Taking? Authorizing Provider  amoxicillin-clavulanate (AUGMENTIN) 875-125 MG per tablet Take  1 tablet by mouth every 12 (twelve) hours. Patient not taking: Reported on 07/18/2015 12/18/14   Carlisle Cater, PA-C  aspirin EC 81 MG tablet Take 1 tablet (81 mg total) by mouth daily. 07/18/15   Gary Prows, MD  carvedilol (COREG) 12.5 MG tablet Take 1 tablet (12.5 mg total) by mouth 2 (two) times daily with a meal. 07/18/15   Gary Prows, MD  clopidogrel (PLAVIX) 75 MG tablet Take 1 tablet (75 mg total) by mouth daily. 07/18/15   Gary Prows, MD  pseudoephedrine  (SUDAFED) 60 MG tablet Take 1 tablet (60 mg total) by mouth every 6 (six) hours as needed. Patient not taking: Reported on 07/18/2015 12/18/14   Carlisle Cater, PA-C     Allergies:   No Known Allergies  Social History:   Social History   Socioeconomic History   Marital status: Single    Spouse name: Not on file   Number of children: Not on file   Years of education: Not on file   Highest education level: Not on file  Occupational History   Not on file  Tobacco Use   Smoking status: Every Day   Smokeless tobacco: Not on file  Substance and Sexual Activity   Alcohol use: No   Drug use: Yes    Types: Marijuana   Sexual activity: Not on file  Other Topics Concern   Not on file  Social History Narrative   Not on file   Social Determinants of Health   Financial Resource Strain: Not on file  Food Insecurity: Not on file  Transportation Needs: Not on file  Physical Activity: Not on file  Stress: Not on file  Social Connections: Not on file  Intimate Partner Violence: Not on file    Family History:   The patient's family history is negative for CAD.    ROS:  Please see the history of present illness.  All other ROS reviewed and negative.     Physical Exam/Data:   Vitals:   12/17/2021 1632 01/04/2022 1645 01/07/2022 1700 12/13/2021 1708  BP:  (!) 149/96    Pulse:  78    Resp:  15    SpO2: 97% 94%  (!) 84%  Weight:   83.9 kg   Height:   '6\' 2"'$  (1.88 m)    No intake or output data in the 24 hours ending 01/06/2022 1719    12/25/2021    5:00 PM 07/18/2015    7:19 AM  Last 3 Weights  Weight (lbs) 185 lb 195 lb  Weight (kg) 83.915 kg 88.451 kg     Body mass index is 23.75 kg/m.  General: Well developed, sallow complexion, uncomfortable appearing Head: Normocephalic, atraumatic, sclera non-icteric, no xanthomas, nares are without discharge. Neck: Negative for carotid bruits. JVP not elevated. Lungs: Clear bilaterally to auscultation without wheezes, rales, or rhonchi. Breathing  is unlabored. Heart: RRR S1 S2 without murmurs, rubs, or gallops.  Abdomen: Soft, non-tender, non-distended with normoactive bowel sounds. No rebound/guarding. Extremities: No clubbing or cyanosis. No edema. Distal pedal pulses are 2+ and equal bilaterally. Neuro: Alert and oriented X 3. Moves all extremities spontaneously. Psych:  Responds to questions appropriately with a reserved affect. Poor eye contact.    EKG:  The ECG that was done today was personally reviewed and demonstrates NSR 84bpm, leftward axis, ST elevation/early repolarization pattern in V1-V6 without prior to compare to, TWI I, avL, V6  Relevant CV Studies: None   Laboratory Data: None   Radiology/Studies:  No  results found.   Assessment and Plan:   1. Chest pain/possible code STEMI, underlying CAD - details of prior CAD are not entirely clear, do not have access to any records from [place] from 1 month ago linked to present encounter or any aliases - EKG nondiagnostic but given recent reported PCI without any medication therapy and severe unrelenting chest pain, plan emergent cath - per d/w Dr. Gwenlyn Found, plan to proceed with cath to exclude acute stent thrombosis as patient left AMA from last admission and has not been taking any medications - case discussed with Dr. Irish Lack as well, who will contact on-call APP upon completion of case to discuss results and plan for admission orders  2. PVD - will need to readdress and discuss post-cath  3. Essential HTN - plan for meds TBD post cath  4. Unknown lipid, glucose, and renal status - labs pending  5. Tobacco abuse, THC use - UDS pending   Risk Assessment/Risk Scores:    TIMI Risk Score for ST  Elevation MI:   The patient's TIMI risk score is 2, which indicates a 2.2% risk of all cause mortality at 30 days.        Severity of Illness: The appropriate patient status for this patient is INPATIENT. Inpatient status is judged to be reasonable and necessary in  order to provide the required intensity of service to ensure the patient's safety. The patient's presenting symptoms, physical exam findings, and initial radiographic and laboratory data in the context of their chronic comorbidities is felt to place them at high risk for further clinical deterioration. Furthermore, it is not anticipated that the patient will be medically stable for discharge from the hospital within 2 midnights of admission.   * I certify that at the point of admission it is my clinical judgment that the patient will require inpatient hospital care spanning beyond 2 midnights from the point of admission due to high intensity of service, high risk for further deterioration and high frequency of surveillance required.*   For questions or updates, please contact [place] Please consult [email or web address] for contact info under     Signed, Charlie Pitter, PA-C  01/09/2022 5:19 PM   Agree with findings by Burna Mortimer  We are asked to see [name]  emergently because of chest pain and abnormal EKG.  He has a history of PAD status post right common iliac artery intervention by Dr. Einar Mcguire proximally 6 years ago.  His other problems include treated hypertension and continued tobacco abuse.  He has had multiple stents in the past according to him the most recent of which was a month ago at [facility].  He apparently left prematurely and has been on no medication since.  He developed chest pain today with an EKG that showed J-point elevation with with reciprocal changes which could represent LVH with repolarization changes.  He is hemodynamically stable.  Exam was benign.  Because of ongoing pain with an abnormal EKG with unclear past history it was elected to bring him urgently to the Cath Lab for angiography to define his anatomy.  Lorretta Harp, M.D., [place], [facility], [place] [address] [place], [place]   [id number]  [phone] 01/10/2022 5:27 PM    Addendum-chronic catheterization performed by Dr. Irish Lack revealed patent stents with nonobstructive CAD.  There were no culprit lesions to explain his symptoms.

## 2021-12-28 NOTE — Progress Notes (Signed)
Pharmacy Consult for heparin gtt  Indication: chest pain/ACS  No Known Allergies  Patient Measurements:   Heparin Dosing Weight:    Assessment: [name] a 41 y.o. male presented with ACS/stemi reporting it feels similar to an MI he had 21 years ago. Patient also reportedly, had a stent placed a couple of weeks PTA at [facility] but left [facility] without receiving medications. Pharmacy has been consulted for heparin dosing.   Anticoagulation PTA: Patient not on anticoagulation PTA.  Wt used 86 kg via care everywhere   Goal of Therapy:  Heparin level 0.3-0.7 units/ml Monitor platelets by anticoagulation protocol: Yes   Plan:  Heparin bolus via infusion 4,000 Units x1  Start heparin infusion at 1,400 units/hour Check heparin level in 6 hours and daily while on heparin, daily CBC  Monitor for signs and symptoms of bleeding   Jani Gravel, PharmD PGY-1 Acute Care Resident  12/27/2021 4:47 PM

## 2021-12-28 NOTE — ED Provider Notes (Signed)
[facility] EMERGENCY DEPARTMENT Provider Note   CSN: [id number] Arrival date & time: 01/02/2022  1631     History  Chief Complaint  Patient presents with   Code STEMI    [name] is a 41 y.o. male.  HPI  Patient has history of coronary artery disease, status postcardiac stents.  Patient presents ED with acute onset of chest pain.  Patient feels that he tight pressure in the center of his chest with a stabbing pain rating to his back.  Patient denies diaphoresis or nausea.  Patient states he had an MI in the past and this feels like his initial MI 21 years ago.  Patient states he was recently at [facility] and had a cardiac procedure recently.  He states he still has bruising on his arm.  Patient ended up leaving AMA from that evaluation.  He states he is not currently on any medications.  Electronic medical record reviewed and there is no notes from a recent hospitalization at [facility] Medications Prior to Admission medications   Medication Sig Start Date End Date Taking? Authorizing Provider  amoxicillin-clavulanate (AUGMENTIN) 875-125 MG per tablet Take 1 tablet by mouth every 12 (twelve) hours. Patient not taking: Reported on 07/18/2015 12/18/14   Renne Crigler, PA-C  aspirin EC 81 MG tablet Take 1 tablet (81 mg total) by mouth daily. 07/18/15   Yates Decamp, MD  carvedilol (COREG) 12.5 MG tablet Take 1 tablet (12.5 mg total) by mouth 2 (two) times daily with a meal. 07/18/15   Yates Decamp, MD  clopidogrel (PLAVIX) 75 MG tablet Take 1 tablet (75 mg total) by mouth daily. 07/18/15   Yates Decamp, MD  pseudoephedrine (SUDAFED) 60 MG tablet Take 1 tablet (60 mg total) by mouth every 6 (six) hours as needed. Patient not taking: Reported on 07/18/2015 12/18/14   Renne Crigler, PA-C      Allergies    Patient has no known allergies.    Review of Systems   Review of Systems  Physical Exam Updated Vital Signs SpO2 97%  Physical Exam Vitals and nursing note  reviewed.  Constitutional:      Appearance: He is well-developed. He is ill-appearing.  HENT:     Head: Normocephalic and atraumatic.     Right Ear: External ear normal.     Left Ear: External ear normal.  Eyes:     General: No scleral icterus.       Right eye: No discharge.        Left eye: No discharge.     Conjunctiva/sclera: Conjunctivae normal.  Neck:     Trachea: No tracheal deviation.  Cardiovascular:     Rate and Rhythm: Normal rate and regular rhythm.  Pulmonary:     Effort: Pulmonary effort is normal. No respiratory distress.     Breath sounds: Normal breath sounds. No stridor. No wheezing or rales.  Abdominal:     General: Bowel sounds are normal. There is no distension.     Palpations: Abdomen is soft.     Tenderness: There is no abdominal tenderness. There is no guarding or rebound.  Musculoskeletal:        General: No tenderness or deformity.     Cervical back: Neck supple.  Skin:    General: Skin is warm and dry.     Findings: No rash.  Neurological:     General: No focal deficit present.     Mental Status: He is alert.  Cranial Nerves: No cranial nerve deficit (no facial droop, extraocular movements intact, no slurred speech).     Sensory: No sensory deficit.     Motor: No abnormal muscle tone or seizure activity.     Coordination: Coordination normal.  Psychiatric:        Mood and Affect: Mood normal.     ED Results / Procedures / Treatments   Labs (all labs ordered are listed, but only abnormal results are displayed) Labs Reviewed  RESP PANEL BY RT-PCR (FLU A&B, COVID) ARPGX2  HEMOGLOBIN A1C  CBC WITH DIFFERENTIAL/PLATELET  PROTIME-INR  APTT  COMPREHENSIVE METABOLIC PANEL  LIPID PANEL  HEPARIN LEVEL (UNFRACTIONATED)  TROPONIN I (HIGH SENSITIVITY)    EKG None  Radiology No results found.  Procedures Procedures    Medications Ordered in ED Medications  0.9 %  sodium chloride infusion (has no administration in time range)  heparin  ADULT infusion 100 units/mL (25000 units/257mL) (has no administration in time range)  heparin injection 4,000 Units (4,000 Units Subcutaneous Given 01/11/2022 1648)    ED Course/ Medical Decision Making/ A&P Clinical Course as of 12/20/2021 1650  Fri Dec 28, 2021  1650 Case discussed with Dr. Allyson Sabal who is at the bedside.  They will take the patient to the Cath Lab [JK]    Clinical Course User Index [JK] Linwood Dibbles, MD                           Medical Decision Making Problems Addressed: ACS (acute coronary syndrome) [facility]): acute illness or injury that poses a threat to life or bodily functions  Amount and/or Complexity of Data Reviewed Labs: ordered.  Risk Prescription drug management. Decision regarding hospitalization.   Patient presented to ED for evaluation of chest pain.  Code STEMI was activated by EMS.  Patient was brought to the ED as there were some atypical features noted on the EKG by cardiology.  Patient was evaluated by cardiology at the bedside.  Patient's history is somewhat unusual.  He states he had recent cardiac cath but we cannot find any records of that.  He is also not on any medications after his recent cardiac catheterization and stents.  Possible patient may be registered under another name at the other medical facility however presentation is concerning for acute coronary syndrome.  Patient has been given heparin.  Cardiology is at the bedside and the plan is to bring him to cardiac catheterization lab for further evaluation.        Final Clinical Impression(s) / ED Diagnoses Final diagnoses:  ACS (acute coronary syndrome) [facility])    Rx / DC Orders ED Discharge Orders     None         Linwood Dibbles, MD 12/15/2021 1653

## 2021-12-28 NOTE — Anesthesia Procedure Notes (Signed)
Central Venous Catheter Insertion Start/End06/06/2022 8:40 PM, 01/04/2022 8:55 PM Patient location: Pre-op. Preanesthetic checklist: patient identified, IV checked, site marked, risks and benefits discussed, surgical consent, monitors and equipment checked, pre-op evaluation, timeout performed and anesthesia consent Position: Trendelenburg Lidocaine 1% used for infiltration and patient sedated Hand hygiene performed , maximum sterile barriers used  and Seldinger technique used Catheter size: 9 Fr Total catheter length 10. Central line was placed.MAC introducer Swan type:thermodilution Procedure performed using ultrasound guided technique. Ultrasound Notes:anatomy identified, needle tip was noted to be adjacent to the nerve/plexus identified, no ultrasound evidence of intravascular and/or intraneural injection and image(s) printed for medical record Attempts: 1 Following insertion, line sutured, dressing applied and Biopatch. Post procedure assessment: blood return through all ports, free fluid flow and no air  Patient tolerated the procedure well with no immediate complications.

## 2021-12-28 NOTE — Anesthesia Procedure Notes (Signed)
Arterial Line Insertion Start/End06/27/2023 8:38 PM, 01/02/2022 8:45 PM  Patient location: OOR procedure area. Preanesthetic checklist: patient identified, IV checked, surgical consent, monitors and equipment checked, pre-op evaluation and timeout performed Emergency situation Patient sedated Left, radial was placed Catheter size: 20 G Hand hygiene performed  and maximum sterile barriers used   Attempts: 1 Procedure performed without using ultrasound guided technique. Following insertion, dressing applied and Biopatch. Post procedure assessment: normal  Patient tolerated the procedure well with no immediate complications.

## 2021-12-28 NOTE — ED Triage Notes (Signed)
Pt BIB [facility] from apt lobby. Hx of stent placement x1 week? Stabbing, tight chest pain to central chest. Pt received 324mg  aspirin and 2 doses nitro en route with minimal improvement. Patient reports feeling like initial MI 21 yrs ago.

## 2021-12-28 NOTE — Anesthesia Preprocedure Evaluation (Addendum)
Anesthesia Evaluation  Patient identified by MRN, date of birth, ID band Patient awake    Reviewed: Allergy & Precautions, H&P , NPO status , Patient's Chart, lab work & pertinent test results, reviewed documented beta blocker date and time   Airway Mallampati: II  TM Distance: >3 FB Neck ROM: Full    Dental no notable dental hx. (+) Teeth Intact, Dental Advisory Given   Pulmonary Current Smoker,    Pulmonary exam normal breath sounds clear to auscultation       Cardiovascular hypertension, Pt. on medications and Pt. on home beta blockers + CAD, + Past MI, + Cardiac Stents and + Peripheral Vascular Disease   Rhythm:Regular Rate:Normal  Type A aortic dissection   Neuro/Psych negative neurological ROS  negative psych ROS   GI/Hepatic negative GI ROS, Neg liver ROS,   Endo/Other  negative endocrine ROS  Renal/GU Renal InsufficiencyRenal disease  negative genitourinary   Musculoskeletal   Abdominal   Peds  Hematology negative hematology ROS (+)   Anesthesia Other Findings   Reproductive/Obstetrics negative OB ROS                            Anesthesia Physical Anesthesia Plan  ASA: 4 and emergent  Anesthesia Plan: General   Post-op Pain Management:    Induction: Intravenous, Rapid sequence and Cricoid pressure planned  PONV Risk Score and Plan: 1  Airway Management Planned: Oral ETT  Additional Equipment: Arterial line, CVP, PA Cath, TEE and Ultrasound Guidance Line Placement  Intra-op Plan:   Post-operative Plan: Post-operative intubation/ventilation  Informed Consent: I have reviewed the patients History and Physical, chart, labs and discussed the procedure including the risks, benefits and alternatives for the proposed anesthesia with the patient or authorized representative who has indicated his/her understanding and acceptance.     Dental advisory given  Plan Discussed  with: CRNA  Anesthesia Plan Comments:         Anesthesia Quick Evaluation

## 2021-12-28 NOTE — Consult Note (Signed)
[address]            [place] [address]          [phone]       [facility] Medical Record #[id number] Date of Birth: 1980/10/17  No ref. provider found Cardiology Dr Allyson Sabal Patient, No Pcp Per  Chief Complaint:    Chief Complaint  Patient presents with   Code STEMI  Patient examined, images of CTA personally reviewed and discussed with patient and his mother in his room in the ICU.  History of Present Illness:     41 year old male with history of previous coronary artery disease status post stents to the LAD and circumflex (as well as to his right iliac artery) presented to the ED with chest pain which started within the hour of presentation.  He described it as severe substernal squeezing pain similar to his initial MI several years ago.  He also has had some shortness of breath.  EKG showed sinus rhythm with some nonspecific EKG changes.  A code STEMI was declared.  He received a loading dose of Brilinta and he underwent emergent cardiac cath which showed patent stents to the LAD and circumflex with EF of 25%.  His creatinine was 1.8 on presentation.  He returned to the ICU and had persistent severe pain of his abdomen and back.  He had associated significant hypertension.  A CTA was performed demonstrating type  A dissection With the intimal tear originating above the aortic valve and extending into the arch as well as into the innominate artery.  The descending thoracic aorta true lumen was significantly compressed by the false lumen.  There was malperfusion of the SMA off the celiac axis as well.  The left renal artery was perfused off the false lumen.  Current Activity/ Functional Status: Unknown but the patient supported himself.   Past Medical History:  Diagnosis Date   CAD (coronary artery disease)    Heart attack (HCC)    Hypertension    PVD (peripheral vascular disease) (HCC)    Tobacco abuse     Past Surgical  History:  Procedure Laterality Date   PERIPHERAL VASCULAR CATHETERIZATION N/A 07/18/2015   Procedure: Abdominal Aortogram;  Surgeon: Yates Decamp, MD;  Location: [facility] INVASIVE CV LAB;  Service: Cardiovascular;  Laterality: N/A;   PERIPHERAL VASCULAR CATHETERIZATION  07/18/2015   Procedure: Lower Extremity Angiography;  Surgeon: Yates Decamp, MD;  Location: [facility] INVASIVE CV LAB;  Service: Cardiovascular;;   PERIPHERAL VASCULAR CATHETERIZATION  07/18/2015   Procedure: Peripheral Vascular Intervention;  Surgeon: Yates Decamp, MD;  Location: [facility] INVASIVE CV LAB;  Service: Cardiovascular;;  RIGHT COMMON ILIAC    Social History   Tobacco Use  Smoking Status Every Day  Smokeless Tobacco Not on file    Social History   Substance and Sexual Activity  Alcohol Use No    Social History   Socioeconomic History   Marital status: Single    Spouse name: Not on file   Number of children: Not on file   Years of education: Not on file   Highest education level: Not on file  Occupational History   Not on file  Tobacco Use   Smoking status: Every Day   Smokeless tobacco: Not on file  Substance and Sexual Activity   Alcohol use: No   Drug use: Yes    Types: Marijuana  Sexual activity: Not on file  Other Topics Concern   Not on file  Social History Narrative   Not on file   Social Determinants of Health   Financial Resource Strain: Not on file  Food Insecurity: Not on file  Transportation Needs: Not on file  Physical Activity: Not on file  Stress: Not on file  Social Connections: Not on file  Intimate Partner Violence: Not on file    No Known Allergies  Current Facility-Administered Medications  Medication Dose Route Frequency Provider Last Rate Last Admin   0.9 %  sodium chloride infusion   Intravenous Continuous Corky Crafts, MD 50 mL/hr at 01/11/2022 2000 Infusion Verify at 01/01/2022 2000   0.9 %  sodium chloride infusion  250 mL Intravenous PRN Corky Crafts, MD        acetaminophen (TYLENOL) tablet 650 mg  650 mg Oral Q4H PRN Corky Crafts, MD       aspirin EC tablet 81 mg  81 mg Oral Daily Corky Crafts, MD       bisacodyl (DULCOLAX) EC tablet 5 mg  5 mg Oral Once Lovett Sox, MD       ceFAZolin (ANCEF) IVPB 2g/100 mL premix  2 g Intravenous To OR Lovett Sox, MD       ceFAZolin (ANCEF) IVPB 2g/100 mL premix  2 g Intravenous To OR Lovett Sox, MD       Melene Muller ON 12/26/2021] chlorhexidine (PERIDEX) 0.12 % solution 15 mL  15 mL Mouth/Throat Once Lovett Sox, MD       dexmedetomidine (PRECEDEX) 400 MCG/100ML (4 mcg/mL) infusion  0.1-0.7 mcg/kg/hr Intravenous To OR Lovett Sox, MD       EPINEPHrine (ADRENALIN) 5 mg in NS 250 mL (0.02 mg/mL) premix infusion  0-10 mcg/min Intravenous To OR Lovett Sox, MD       fentaNYL (SUBLIMAZE) injection 12.5 mcg  12.5 mcg Intravenous Once Azalee Course, PA       furosemide (LASIX) injection 40 mg  40 mg Intravenous BID Corky Crafts, MD       heparin 30,000 units/NS 1000 mL solution for CELLSAVER   Other To OR Lovett Sox, MD       heparin sodium (porcine) 2,500 Units, papaverine 30 mg in electrolyte-A (PLASMALYTE-A PH 7.4) 500 mL irrigation   Irrigation To OR Lovett Sox, MD       hydrALAZINE (APRESOLINE) injection 10 mg  10 mg Intravenous Q20 Min PRN Lance Muss S, MD       insulin regular, human (MYXREDLIN) 100 units/ 100 mL infusion   Intravenous To OR Lovett Sox, MD       Maryagnes Amos Blood Cardioplegia vial (lidocaine/magnesium/mannitol 7.82N-5A-2.1H)   Intracoronary To OR Lovett Sox, MD       labetalol (NORMODYNE) infusion 5 mg/mL  0.5-3 mg/min Intravenous Titrated Azalee Course, [place] 18 mL/hr at 01/01/2022 2013 1.5 mg/min at 01/02/2022 2013   magnesium sulfate (IV Push/IM) injection 40 mEq  40 mEq Other To OR Lovett Sox, MD       Melene Muller ON 12/17/2021] metoprolol tartrate (LOPRESSOR) tablet 12.5 mg  12.5 mg Oral Once Lovett Sox, MD       midazolam (VERSED)  injection 1 mg  1 mg Intravenous Q4H PRN Azalee Course, PA       milrinone (PRIMACOR) 20 MG/100 ML (0.2 mg/mL) infusion  0.3 mcg/kg/min Intravenous To OR Lovett Sox, MD       morphine (PF) 2 MG/ML injection 2 mg  2 mg Intravenous  Q1H PRN Azalee Course, PA   2 mg at 12/19/2021 2027   nitroGLYCERIN 50 mg in dextrose 5 % 250 mL (0.2 mg/mL) infusion  0-200 mcg/min Intravenous Titrated Azalee Course, PA 60 mL/hr at 01/07/2022 2006 200 mcg/min at 01/05/2022 2006   nitroGLYCERIN 50 mg in dextrose 5 % 250 mL (0.2 mg/mL) infusion  2-200 mcg/min Intravenous To OR Lovett Sox, MD       norepinephrine (LEVOPHED) 4mg  in (0.016 mg/mL) premix infusion  0-40 mcg/min Intravenous To OR , MD       ondansetron [facility]) injection 4 mg  4 mg Intravenous Q6H PRN [facility], MD   4 mg at 12/23/2021 1758   phenylephrine (NEO-SYNEPHRINE) 20mg /NS 12/13/2021 premix infusion  30-200 mcg/min Intravenous To OR , MD       potassium chloride injection 80 mEq  80 mEq Other To OR , MD       rosuvastatin (CRESTOR) tablet 40 mg  40 mg Oral Daily Lovett Sox, MD       sodium chloride flush (NS) 0.9 % injection 3 mL  3 mL Intravenous Q12H Lovett Sox, MD       sodium chloride flush (NS) 0.9 % injection 3 mL  3 mL Intravenous PRN Corky Crafts, MD       temazepam (RESTORIL) capsule 15 mg  15 mg Oral Once PRN Corky Crafts, MD       tranexamic acid (CYKLOKAPRON) 2,500 mg in sodium chloride 0.9 % 250 mL (10 mg/mL) infusion  1.5 mg/kg/hr Intravenous To OR Corky Crafts, MD       tranexamic acid (CYKLOKAPRON) bolus via infusion - over 30 minutes 1,258.5 mg  15 mg/kg Intravenous To OR Lovett Sox, MD       tranexamic acid (CYKLOKAPRON) pump prime solution 168 mg  2 mg/kg Intracatheter To OR Lovett Sox, MD       vancomycin (VANCOREADY) IVPB 1250 mg/250 mL  1,250 mg Intravenous To OR Lovett Sox, MD         Family History  Problem Relation Age of Onset    CAD Neg Hx      Review of Systems:     Cardiac Review of Systems: Y or N  Chest Pain [   yes]  Resting SOB [   ] Exertional SOB  [yes]  Orthopnea [  ]   Pedal Edema [   ]    Palpitations [  ] Syncope  [  ]   Presyncope [   ]  General Review of Systems: [Y] = yes [  ]=no Constitional: recent weight change [  ]; anorexia [  ]; fatigue [  ]; nausea [  ]; night sweats [  ]; fever [  ]; or chills [  ];  Dental: poor dentition[  ]; Last Dentist visit: Unknown  Eye : blurred vision [  ]; diplopia [   ]; vision changes [  ];  Amaurosis fugax[  ]; Resp: cough [yes];  wheezing[  ];  hemoptysis[  ]; shortness of breath[  ]; paroxysmal nocturnal dyspnea[  ]; dyspnea on exertion[yes]; or orthopnea[  ];  GI:  gallstones[  ], vomiting[yes];  dysphagia[  ]; melena[  ];  hematochezia [  ]; heartburn[yes with abdominal pain];   Hx of  Colonoscopy[  ]; GU: kidney stones [  ]; hematuria[  ];   dysuria [  ];  nocturia[  ];  history of     obstruction [  ];                 Skin: rash, swelling[  ];, hair loss[  ];  peripheral edema[  ];  or itching[  ]; Musculosketetal: myalgias[  ];  joint swelling[  ];  joint erythema[  ];  joint pain[  ];  back pain[  ];  Heme/Lymph: bruising[  ];  bleeding[  ];  anemia[  ];  Neuro: TIA[  ];  headaches[  ];  stroke[  ];  vertigo[  ];  seizures[  ];   paresthesias[  ];  difficulty walking[  ];  Psych:depression[  ]; anxiety[  ];  Endocrine: diabetes[  ];  thyroid dysfunction[  ];  Immunizations: Flu [  ]; Pneumococcal[  ];  Other:  Physical Exam: BP (!) 183/84   Pulse 71   Resp 16   Ht 6\' 2"  (1.88 m)   Wt 83.9 kg   SpO2 100%   BMI 23.75 kg/m       Physical Exam  General: 41 year old BM responsive but in clear distress mainly complaining of lower abdominal pain with nausea and vomiting. HEENT: Normocephalic pupils equal ,  dentition adequate Neck: Supple without JVD, adenopathy, or bruit Chest: Clear to auscultation, symmetrical breath sounds, no rhonchi, no tenderness             or deformity Cardiovascular: Regular rate and rhythm, no murmur, no gallop, peripheral pulses were not palpable in the femoral vessels or pedal vessels.  Carotid pulses are palpable bilaterally.            Abdomen: Mildly distended but with significant tenderness , diminished bowel sounds.  No palpable mass or organomegaly Extremities: Cool but with intact motor function.              no venous stasis changes of the legs Rectal/GU: Deferred Neuro: Grossly non--focal and symmetrical throughout Skin: Diaphoretic general without rash or ulceration    Diagnostic Studies & Laboratory data:     Recent Radiology Findings:   CT Angio Chest/Abd/Pel for Dissection W and/or W/WO  Result Date: 01/06/2022 CLINICAL DATA:  Chest pain or back pain, aortic dissection suspected. Diaphoresis, chest pain EXAM: CT ANGIOGRAPHY CHEST, ABDOMEN AND PELVIS TECHNIQUE: Multidetector CT imaging through the chest, abdomen and pelvis was performed using the standard protocol during bolus administration of intravenous contrast. Multiplanar reconstructed images and MIPs were obtained and reviewed to evaluate the vascular anatomy. RADIATION DOSE REDUCTION: This exam was performed according to the departmental dose-optimization program which includes automated exposure control, adjustment of the mA and/or kV according to patient size and/or use of iterative reconstruction technique. CONTRAST:  12/27/2021 OMNIPAQUE IOHEXOL 350 MG/ML SOLN COMPARISON:  None Available. FINDINGS: CTA CHEST FINDINGS Cardiovascular: There is a type a dissection present with the dissection flap demonstrating  wide communication between the true and false lumens, best seen on image # 79/8 and with the dissection flap extending inferiorly to involve the lateral aspect of the right coronary artery cusp and  non coronary artery cusp. There is no pericardial effusion. The dissection flap extends to involve the arch vasculature and extends into the innominate artery and subsequently into the right common carotid and right subclavian arteries. The left common carotid artery and subclavian artery arise from the true lumen. Within the descending aorta, the true lumen is compressed and is nearly obliterated, best appreciated at image # 71 and # 137. The ascending aorta is mildly dilated measuring 4.5 cm in maximal diameter. The descending thoracic aorta is of normal caliber. Left anterior descending and circumflex coronary artery stenting has been performed. Global cardiac size is mildly enlarged. Central pulmonary arteries are poorly opacified. The central pulmonary arteries are of normal caliber. Mediastinum/Nodes: There is infiltration of the mediastinal fat which may relate to interstitial hemorrhage or edema related to aortic dissection. There is, however, no mass formed hematoma identified, however. Visualized thyroid is unremarkable. Esophagus is unremarkable. No pathologic thoracic adenopathy. Lungs/Pleura: Lungs are clear. No pleural effusion or pneumothorax. Musculoskeletal: No acute bone abnormality Review of the MIP images confirms the above findings. CTA ABDOMEN AND PELVIS FINDINGS VASCULAR Aorta: The dissection flap extends through the abdominal aorta and the true lumen is nearly obliterated at the diaphragmatic hiatus, as mentioned above and is subsequently occluded just proximal to the aortic bifurcation. No aneurysm. No periaortic inflammatory change or hemorrhage. Celiac: The dissection flap extends into the celiac axis and common hepatic artery. No aneurysm. Widely patent distally. SMA: The dissection flap extends into the superior mesenteric artery and there is occlusion of the mid and distal superior mesenteric artery seen on axial image # 194-196. Only the first jejunal branch is opacified. Renals: Right  renal artery arises from the true lumen. Left renal artery arises from the false lumen. Both vessels are widely patent and there is symmetric enhancement of the kidneys. IMA: Arises from the false lumen and is widely patent. Inflow: Right common iliac artery stenting has been performed. There is a high-grade stenosis (greater than 75%) just proximal to the stented segment. There is, additionally, a greater than 75% stenosis of the distal common iliac artery at the terminal portion of the stent best seen on image # 256. The dissection flap extends into the left common iliac artery which demonstrates a high-grade stenosis at image # 237 and subsequently at the common iliac bifurcation at image # 251 Veins: No obvious venous abnormality within the limitations of this arterial phase study. Review of the MIP images confirms the above findings. NON-VASCULAR Hepatobiliary: No focal liver abnormality is seen. No gallstones, gallbladder wall thickening, or biliary dilatation. Pancreas: Unremarkable Spleen: Unremarkable Adrenals/Urinary Tract: Adrenal glands are unremarkable. Kidneys are normal, without renal calculi, focal lesion, or hydronephrosis. Bladder is unremarkable. Stomach/Bowel: Stomach is within normal limits. Appendix appears normal. No evidence of bowel wall thickening, distention, or inflammatory changes. Lymphatic: No pathologic adenopathy within the abdomen and pelvis. Reproductive: Prostate is unremarkable. Other: No abdominal wall hernia Musculoskeletal: No acute bone abnormality Review of the MIP images confirms the above findings. IMPRESSION: 1. Type A aortic dissection with wide communication between the true and false lumens. The dissection flap extends into the arch vasculature and subsequently into the right common carotid and right subclavian arteries. The dissection flap extends into the celiac axis and common hepatic artery. The dissection flap  extends into the superior mesenteric artery and there  is occlusion of the mid and distal superior mesenteric artery. The left renal artery and IMA arise from the false lumen and are widely patent. The ascending aorta is mildly dilated measuring 4.5 cm in maximal diameter. 2. Infiltration of the mediastinal fat which may relate to interstitial hemorrhage or edema related to aortic dissection. There is, however, no formed hematoma identified. 3. Bilateral common iliac artery stents with high-grade stenosis just proximal to the stented segment and at the terminal portion of the stented segment. 4. High-grade stenosis of the left common iliac artery at the common iliac bifurcation. 5. Mild global cardiomegaly. Left anterior descending and circumflex coronary artery stenting. 6. These results were called by telephone at the time of interpretation on Jan 21, 2022 at 7:29 pm to provider [facility] , who verbally acknowledged these results. Electronically Signed   By: Helyn Numbers M.D.   On: 01/21/22 19:29   CARDIAC CATHETERIZATION  Result Date: January 21, 2022   Prox RCA lesion is 50% stenosed.   Previously placed Prox LAD to Mid LAD stent of unknown type is  widely patent.   Previously placed Prox Cx to Dist Cx stent of unknown type is  widely patent.   There is moderate to severe left ventricular systolic dysfunction.   LV end diastolic pressure is severely elevated.   The left ventricular ejection fraction is 25-35% by visual estimate.   There is no aortic valve stenosis. Multiple prior stents present in the LAD and circumflex, all of which are widely patent.  RCA with moderate disease.  He appears to have significant LV dysfunction including anterior wall hypokinesis.  Significantly elevated LVEDP.  Per the history, he has not been taking antiplatelet medications despite having a recent PCI at [facility]. IV nitroglycerin started.  Equal radial pulses on both sides.  No trouble navigating the ascending aorta during catheterization.  No obvious evidence of aortic dissection.  Patient was loaded with Brilinta. Home medicines list Plavix.  Plan to start Plavix tomorrow. He needs aggressive heart failure management, diuresis and blood pressure control. I spoke to the patient's mother, Alvira Monday, [phone].      Recent Lab Findings: Lab Results  Component Value Date   WBC 12.9 (H) Jan 21, 2022   HGB 11.6 (L) Jan 21, 2022   HCT 34.0 (L) 21-Jan-2022   PLT 231 01/21/2022   GLUCOSE 153 (H) 2022-01-21   CHOL 170 Jan 21, 2022   TRIG 64 January 21, 2022   HDL 48 January 21, 2022   LDLCALC 109 (H) 2022-01-21   ALT 14 2022/01/21   AST 17 2022/01/21   NA 140 01/21/22   K 3.5 2022-01-21   CL 106 2022-01-21   CREATININE 1.80 (H) 01/21/2022   BUN 22 (H) 01/21/2022   CO2 22 2022-01-21   INR 1.1 January 21, 2022      Assessment / Plan:   Acute type A ascending aortic dissection.  There is significant compression of the descending thoracic aortic true lumen with poor distal perfusion of the abdominal viscera and lower extremities.  Neurologically no lower extremity weakness.  Previous history of coronary disease with MI and reduced LV function EF 25%.  Patient was loaded with Brilinta 180 mg this afternoon prior to cardiac catheterization.  High risk emergency aortic dissection repair was recommended to the patient and his mother.  They understand the high risk of the procedure with underlying serious issues with LV dysfunction, platelet inhibition with recent load of Brilinta, and the very concerning symptoms of abdominal pain and dilated loops  of bowel on CT scan from malperfusion.  I discussed the situation including the benefits urgent surgery, the risk of surgery, the risks of nonsurgical care and they both agreed to high risk for surgery.

## 2021-12-29 ENCOUNTER — Inpatient Hospital Stay (HOSPITAL_COMMUNITY): Admission: EM | Disposition: E | Payer: Self-pay | Source: Home / Self Care | Attending: Cardiothoracic Surgery

## 2021-12-29 ENCOUNTER — Inpatient Hospital Stay (HOSPITAL_COMMUNITY): Payer: BC Managed Care – PPO

## 2021-12-29 DIAGNOSIS — R570 Cardiogenic shock: Secondary | ICD-10-CM

## 2021-12-29 DIAGNOSIS — I5082 Biventricular heart failure: Secondary | ICD-10-CM

## 2021-12-29 DIAGNOSIS — I351 Nonrheumatic aortic (valve) insufficiency: Secondary | ICD-10-CM

## 2021-12-29 DIAGNOSIS — I71019 Dissection of thoracic aorta, unspecified: Secondary | ICD-10-CM | POA: Diagnosis present

## 2021-12-29 HISTORY — PX: TEMPORARY DIALYSIS CATHETER: CATH118312

## 2021-12-29 HISTORY — PX: VENTRICULAR ASSIST DEVICE INSERTION: CATH118273

## 2021-12-29 LAB — POCT I-STAT 7, (LYTES, BLD GAS, ICA,H+H)
Acid-base deficit: 2 mmol/L (ref 0.0–2.0)
Acid-base deficit: 2 mmol/L (ref 0.0–2.0)
Acid-base deficit: 3 mmol/L — ABNORMAL HIGH (ref 0.0–2.0)
Acid-base deficit: 3 mmol/L — ABNORMAL HIGH (ref 0.0–2.0)
Acid-base deficit: 4 mmol/L — ABNORMAL HIGH (ref 0.0–2.0)
Acid-base deficit: 5 mmol/L — ABNORMAL HIGH (ref 0.0–2.0)
Acid-base deficit: 5 mmol/L — ABNORMAL HIGH (ref 0.0–2.0)
Acid-base deficit: 5 mmol/L — ABNORMAL HIGH (ref 0.0–2.0)
Acid-base deficit: 6 mmol/L — ABNORMAL HIGH (ref 0.0–2.0)
Bicarbonate: 18.9 mmol/L — ABNORMAL LOW (ref 20.0–28.0)
Bicarbonate: 19.3 mmol/L — ABNORMAL LOW (ref 20.0–28.0)
Bicarbonate: 20 mmol/L (ref 20.0–28.0)
Bicarbonate: 21 mmol/L (ref 20.0–28.0)
Bicarbonate: 21.4 mmol/L (ref 20.0–28.0)
Bicarbonate: 21.7 mmol/L (ref 20.0–28.0)
Bicarbonate: 21.9 mmol/L (ref 20.0–28.0)
Bicarbonate: 22.8 mmol/L (ref 20.0–28.0)
Bicarbonate: 23.7 mmol/L (ref 20.0–28.0)
Calcium, Ion: 0.84 mmol/L — CL (ref 1.15–1.40)
Calcium, Ion: 0.94 mmol/L — ABNORMAL LOW (ref 1.15–1.40)
Calcium, Ion: 0.95 mmol/L — ABNORMAL LOW (ref 1.15–1.40)
Calcium, Ion: 0.97 mmol/L — ABNORMAL LOW (ref 1.15–1.40)
Calcium, Ion: 1.01 mmol/L — ABNORMAL LOW (ref 1.15–1.40)
Calcium, Ion: 1.03 mmol/L — ABNORMAL LOW (ref 1.15–1.40)
Calcium, Ion: 1.03 mmol/L — ABNORMAL LOW (ref 1.15–1.40)
Calcium, Ion: 1.07 mmol/L — ABNORMAL LOW (ref 1.15–1.40)
Calcium, Ion: 1.08 mmol/L — ABNORMAL LOW (ref 1.15–1.40)
HCT: 19 % — ABNORMAL LOW (ref 39.0–52.0)
HCT: 20 % — ABNORMAL LOW (ref 39.0–52.0)
HCT: 21 % — ABNORMAL LOW (ref 39.0–52.0)
HCT: 21 % — ABNORMAL LOW (ref 39.0–52.0)
HCT: 22 % — ABNORMAL LOW (ref 39.0–52.0)
HCT: 25 % — ABNORMAL LOW (ref 39.0–52.0)
HCT: 26 % — ABNORMAL LOW (ref 39.0–52.0)
HCT: 26 % — ABNORMAL LOW (ref 39.0–52.0)
HCT: 27 % — ABNORMAL LOW (ref 39.0–52.0)
Hemoglobin: 6.5 g/dL — CL (ref 13.0–17.0)
Hemoglobin: 6.8 g/dL — CL (ref 13.0–17.0)
Hemoglobin: 7.1 g/dL — ABNORMAL LOW (ref 13.0–17.0)
Hemoglobin: 7.1 g/dL — ABNORMAL LOW (ref 13.0–17.0)
Hemoglobin: 7.5 g/dL — ABNORMAL LOW (ref 13.0–17.0)
Hemoglobin: 8.5 g/dL — ABNORMAL LOW (ref 13.0–17.0)
Hemoglobin: 8.8 g/dL — ABNORMAL LOW (ref 13.0–17.0)
Hemoglobin: 8.8 g/dL — ABNORMAL LOW (ref 13.0–17.0)
Hemoglobin: 9.2 g/dL — ABNORMAL LOW (ref 13.0–17.0)
O2 Saturation: 100 %
O2 Saturation: 100 %
O2 Saturation: 100 %
O2 Saturation: 100 %
O2 Saturation: 100 %
O2 Saturation: 95 %
O2 Saturation: 99 %
O2 Saturation: 99 %
O2 Saturation: 99 %
Patient temperature: 34.5
Patient temperature: 35
Patient temperature: 36
Patient temperature: 37.3
Potassium: 3.9 mmol/L (ref 3.5–5.1)
Potassium: 4 mmol/L (ref 3.5–5.1)
Potassium: 4 mmol/L (ref 3.5–5.1)
Potassium: 4.4 mmol/L (ref 3.5–5.1)
Potassium: 4.4 mmol/L (ref 3.5–5.1)
Potassium: 4.4 mmol/L (ref 3.5–5.1)
Potassium: 4.5 mmol/L (ref 3.5–5.1)
Potassium: 4.5 mmol/L (ref 3.5–5.1)
Potassium: 4.5 mmol/L (ref 3.5–5.1)
Sodium: 137 mmol/L (ref 135–145)
Sodium: 138 mmol/L (ref 135–145)
Sodium: 139 mmol/L (ref 135–145)
Sodium: 141 mmol/L (ref 135–145)
Sodium: 141 mmol/L (ref 135–145)
Sodium: 142 mmol/L (ref 135–145)
Sodium: 143 mmol/L (ref 135–145)
Sodium: 144 mmol/L (ref 135–145)
Sodium: 144 mmol/L (ref 135–145)
TCO2: 20 mmol/L — ABNORMAL LOW (ref 22–32)
TCO2: 20 mmol/L — ABNORMAL LOW (ref 22–32)
TCO2: 21 mmol/L — ABNORMAL LOW (ref 22–32)
TCO2: 22 mmol/L (ref 22–32)
TCO2: 23 mmol/L (ref 22–32)
TCO2: 23 mmol/L (ref 22–32)
TCO2: 23 mmol/L (ref 22–32)
TCO2: 24 mmol/L (ref 22–32)
TCO2: 25 mmol/L (ref 22–32)
pCO2 arterial: 27.5 mmHg — ABNORMAL LOW (ref 32–48)
pCO2 arterial: 27.7 mmHg — ABNORMAL LOW (ref 32–48)
pCO2 arterial: 31.1 mmHg — ABNORMAL LOW (ref 32–48)
pCO2 arterial: 35.9 mmHg (ref 32–48)
pCO2 arterial: 38.1 mmHg (ref 32–48)
pCO2 arterial: 39.3 mmHg (ref 32–48)
pCO2 arterial: 40.4 mmHg (ref 32–48)
pCO2 arterial: 42.7 mmHg (ref 32–48)
pCO2 arterial: 47.6 mmHg (ref 32–48)
pH, Arterial: 7.306 — ABNORMAL LOW (ref 7.35–7.45)
pH, Arterial: 7.308 — ABNORMAL LOW (ref 7.35–7.45)
pH, Arterial: 7.328 — ABNORMAL LOW (ref 7.35–7.45)
pH, Arterial: 7.338 — ABNORMAL LOW (ref 7.35–7.45)
pH, Arterial: 7.355 (ref 7.35–7.45)
pH, Arterial: 7.4 (ref 7.35–7.45)
pH, Arterial: 7.408 (ref 7.35–7.45)
pH, Arterial: 7.432 (ref 7.35–7.45)
pH, Arterial: 7.482 — ABNORMAL HIGH (ref 7.35–7.45)
pO2, Arterial: 106 mmHg (ref 83–108)
pO2, Arterial: 161 mmHg — ABNORMAL HIGH (ref 83–108)
pO2, Arterial: 174 mmHg — ABNORMAL HIGH (ref 83–108)
pO2, Arterial: 278 mmHg — ABNORMAL HIGH (ref 83–108)
pO2, Arterial: 279 mmHg — ABNORMAL HIGH (ref 83–108)
pO2, Arterial: 319 mmHg — ABNORMAL HIGH (ref 83–108)
pO2, Arterial: 349 mmHg — ABNORMAL HIGH (ref 83–108)
pO2, Arterial: 373 mmHg — ABNORMAL HIGH (ref 83–108)
pO2, Arterial: 88 mmHg (ref 83–108)

## 2021-12-29 LAB — CBC
HCT: 27.1 % — ABNORMAL LOW (ref 39.0–52.0)
HCT: 30.5 % — ABNORMAL LOW (ref 39.0–52.0)
HCT: 25.4 % — ABNORMAL LOW (ref 39.0–52.0)
Hemoglobin: 9.3 g/dL — ABNORMAL LOW (ref 13.0–17.0)
Hemoglobin: 10.7 g/dL — ABNORMAL LOW (ref 13.0–17.0)
Hemoglobin: 9 g/dL — ABNORMAL LOW (ref 13.0–17.0)
MCH: 30.5 pg (ref 26.0–34.0)
MCH: 30.1 pg (ref 26.0–34.0)
MCH: 30.4 pg (ref 26.0–34.0)
MCHC: 34.3 g/dL (ref 30.0–36.0)
MCHC: 35.1 g/dL (ref 30.0–36.0)
MCHC: 35.4 g/dL (ref 30.0–36.0)
MCV: 88.9 fL (ref 80.0–100.0)
MCV: 85.9 fL (ref 80.0–100.0)
MCV: 85.8 fL (ref 80.0–100.0)
Platelets: 82 10*3/uL — ABNORMAL LOW (ref 150–400)
Platelets: 86 10*3/uL — ABNORMAL LOW (ref 150–400)
Platelets: 67 10*3/uL — ABNORMAL LOW (ref 150–400)
RBC: 3.05 MIL/uL — ABNORMAL LOW (ref 4.22–5.81)
RBC: 3.55 MIL/uL — ABNORMAL LOW (ref 4.22–5.81)
RBC: 2.96 MIL/uL — ABNORMAL LOW (ref 4.22–5.81)
RDW: 14.2 % (ref 11.5–15.5)
RDW: 15.1 % (ref 11.5–15.5)
RDW: 15.1 % (ref 11.5–15.5)
WBC: 6.2 10*3/uL (ref 4.0–10.5)
WBC: 8.7 10*3/uL (ref 4.0–10.5)
WBC: 8.1 10*3/uL (ref 4.0–10.5)
nRBC: 0 % (ref 0.0–0.2)
nRBC: 0.2 % (ref 0.0–0.2)
nRBC: 0.2 % (ref 0.0–0.2)

## 2021-12-29 LAB — COOXEMETRY PANEL
Carboxyhemoglobin: 0.8 % (ref 0.5–1.5)
Carboxyhemoglobin: 1.4 % (ref 0.5–1.5)
Methemoglobin: <0.7 % (ref 0.0–1.5)
Methemoglobin: 1.5 % (ref 0.0–1.5)
O2 Saturation: 36.6 %
O2 Saturation: 46.6 %
Total hemoglobin: 10.3 g/dL — ABNORMAL LOW (ref 12.0–16.0)
Total hemoglobin: <6.9 g/dL — CL (ref 12.0–16.0)

## 2021-12-29 LAB — PROTIME-INR
INR: 1.4 — ABNORMAL HIGH (ref 0.8–1.2)
INR: 1.6 — ABNORMAL HIGH (ref 0.8–1.2)
INR: 1 (ref 0.8–1.2)
Prothrombin Time: 16.7 seconds — ABNORMAL HIGH (ref 11.4–15.2)
Prothrombin Time: 18.5 seconds — ABNORMAL HIGH (ref 11.4–15.2)
Prothrombin Time: 13.3 seconds (ref 11.4–15.2)

## 2021-12-29 LAB — POCT I-STAT, CHEM 8
BUN: 19 mg/dL (ref 6–20)
BUN: 19 mg/dL (ref 6–20)
BUN: 20 mg/dL (ref 6–20)
BUN: 20 mg/dL (ref 6–20)
BUN: 21 mg/dL — ABNORMAL HIGH (ref 6–20)
BUN: 21 mg/dL — ABNORMAL HIGH (ref 6–20)
Calcium, Ion: 0.92 mmol/L — ABNORMAL LOW (ref 1.15–1.40)
Calcium, Ion: 0.95 mmol/L — ABNORMAL LOW (ref 1.15–1.40)
Calcium, Ion: 0.99 mmol/L — ABNORMAL LOW (ref 1.15–1.40)
Calcium, Ion: 1.01 mmol/L — ABNORMAL LOW (ref 1.15–1.40)
Calcium, Ion: 1.04 mmol/L — ABNORMAL LOW (ref 1.15–1.40)
Calcium, Ion: 1.05 mmol/L — ABNORMAL LOW (ref 1.15–1.40)
Chloride: 101 mmol/L (ref 98–111)
Chloride: 102 mmol/L (ref 98–111)
Chloride: 103 mmol/L (ref 98–111)
Chloride: 104 mmol/L (ref 98–111)
Chloride: 104 mmol/L (ref 98–111)
Chloride: 106 mmol/L (ref 98–111)
Creatinine, Ser: 1.3 mg/dL — ABNORMAL HIGH (ref 0.61–1.24)
Creatinine, Ser: 1.3 mg/dL — ABNORMAL HIGH (ref 0.61–1.24)
Creatinine, Ser: 1.4 mg/dL — ABNORMAL HIGH (ref 0.61–1.24)
Creatinine, Ser: 1.4 mg/dL — ABNORMAL HIGH (ref 0.61–1.24)
Creatinine, Ser: 1.4 mg/dL — ABNORMAL HIGH (ref 0.61–1.24)
Creatinine, Ser: 1.5 mg/dL — ABNORMAL HIGH (ref 0.61–1.24)
Glucose, Bld: 150 mg/dL — ABNORMAL HIGH (ref 70–99)
Glucose, Bld: 152 mg/dL — ABNORMAL HIGH (ref 70–99)
Glucose, Bld: 170 mg/dL — ABNORMAL HIGH (ref 70–99)
Glucose, Bld: 178 mg/dL — ABNORMAL HIGH (ref 70–99)
Glucose, Bld: 185 mg/dL — ABNORMAL HIGH (ref 70–99)
Glucose, Bld: 198 mg/dL — ABNORMAL HIGH (ref 70–99)
HCT: 22 % — ABNORMAL LOW (ref 39.0–52.0)
HCT: 23 % — ABNORMAL LOW (ref 39.0–52.0)
HCT: 25 % — ABNORMAL LOW (ref 39.0–52.0)
HCT: 25 % — ABNORMAL LOW (ref 39.0–52.0)
HCT: 26 % — ABNORMAL LOW (ref 39.0–52.0)
HCT: 27 % — ABNORMAL LOW (ref 39.0–52.0)
Hemoglobin: 7.5 g/dL — ABNORMAL LOW (ref 13.0–17.0)
Hemoglobin: 7.8 g/dL — ABNORMAL LOW (ref 13.0–17.0)
Hemoglobin: 8.5 g/dL — ABNORMAL LOW (ref 13.0–17.0)
Hemoglobin: 8.5 g/dL — ABNORMAL LOW (ref 13.0–17.0)
Hemoglobin: 8.8 g/dL — ABNORMAL LOW (ref 13.0–17.0)
Hemoglobin: 9.2 g/dL — ABNORMAL LOW (ref 13.0–17.0)
Potassium: 4 mmol/L (ref 3.5–5.1)
Potassium: 4.3 mmol/L (ref 3.5–5.1)
Potassium: 4.4 mmol/L (ref 3.5–5.1)
Potassium: 4.4 mmol/L (ref 3.5–5.1)
Potassium: 4.4 mmol/L (ref 3.5–5.1)
Potassium: 4.5 mmol/L (ref 3.5–5.1)
Sodium: 138 mmol/L (ref 135–145)
Sodium: 138 mmol/L (ref 135–145)
Sodium: 140 mmol/L (ref 135–145)
Sodium: 142 mmol/L (ref 135–145)
Sodium: 143 mmol/L (ref 135–145)
Sodium: 143 mmol/L (ref 135–145)
TCO2: 20 mmol/L — ABNORMAL LOW (ref 22–32)
TCO2: 21 mmol/L — ABNORMAL LOW (ref 22–32)
TCO2: 22 mmol/L (ref 22–32)
TCO2: 22 mmol/L (ref 22–32)
TCO2: 25 mmol/L (ref 22–32)
TCO2: 25 mmol/L (ref 22–32)

## 2021-12-29 LAB — LACTATE DEHYDROGENASE
LDH: 1131 U/L — ABNORMAL HIGH (ref 98–192)
LDH: 1380 U/L — ABNORMAL HIGH (ref 98–192)
LDH: 1172 U/L — ABNORMAL HIGH (ref 98–192)

## 2021-12-29 LAB — RENAL FUNCTION PANEL
Albumin: 2.3 g/dL — ABNORMAL LOW (ref 3.5–5.0)
Anion gap: 13 (ref 5–15)
BUN: 22 mg/dL — ABNORMAL HIGH (ref 6–20)
CO2: 20 mmol/L — ABNORMAL LOW (ref 22–32)
Calcium: 6.5 mg/dL — ABNORMAL LOW (ref 8.9–10.3)
Chloride: 109 mmol/L (ref 98–111)
Creatinine, Ser: 2.21 mg/dL — ABNORMAL HIGH (ref 0.61–1.24)
GFR, Estimated: 37 mL/min — ABNORMAL LOW (ref >60)
Glucose, Bld: 170 mg/dL — ABNORMAL HIGH (ref 70–99)
Phosphorus: 3.7 mg/dL (ref 2.5–4.6)
Potassium: 3.5 mmol/L (ref 3.5–5.1)
Sodium: 142 mmol/L (ref 135–145)

## 2021-12-29 LAB — GLUCOSE, CAPILLARY
Glucose-Capillary: 135 mg/dL — ABNORMAL HIGH (ref 70–99)
Glucose-Capillary: 153 mg/dL — ABNORMAL HIGH (ref 70–99)
Glucose-Capillary: 165 mg/dL — ABNORMAL HIGH (ref 70–99)
Glucose-Capillary: 166 mg/dL — ABNORMAL HIGH (ref 70–99)
Glucose-Capillary: 168 mg/dL — ABNORMAL HIGH (ref 70–99)
Glucose-Capillary: 169 mg/dL — ABNORMAL HIGH (ref 70–99)
Glucose-Capillary: 173 mg/dL — ABNORMAL HIGH (ref 70–99)
Glucose-Capillary: 174 mg/dL — ABNORMAL HIGH (ref 70–99)
Glucose-Capillary: 182 mg/dL — ABNORMAL HIGH (ref 70–99)
Glucose-Capillary: 201 mg/dL — ABNORMAL HIGH (ref 70–99)

## 2021-12-29 LAB — PREPARE RBC (CROSSMATCH)

## 2021-12-29 LAB — POCT I-STAT EG7
Acid-base deficit: 2 mmol/L (ref 0.0–2.0)
Acid-base deficit: 4 mmol/L — ABNORMAL HIGH (ref 0.0–2.0)
Bicarbonate: 22.5 mmol/L (ref 20.0–28.0)
Bicarbonate: 24.5 mmol/L (ref 20.0–28.0)
Calcium, Ion: 1 mmol/L — ABNORMAL LOW (ref 1.15–1.40)
Calcium, Ion: 1.07 mmol/L — ABNORMAL LOW (ref 1.15–1.40)
HCT: 20 % — ABNORMAL LOW (ref 39.0–52.0)
HCT: 24 % — ABNORMAL LOW (ref 39.0–52.0)
Hemoglobin: 6.8 g/dL — CL (ref 13.0–17.0)
Hemoglobin: 8.2 g/dL — ABNORMAL LOW (ref 13.0–17.0)
O2 Saturation: 51 %
O2 Saturation: 80 %
Potassium: 3.6 mmol/L (ref 3.5–5.1)
Potassium: 4.3 mmol/L (ref 3.5–5.1)
Sodium: 140 mmol/L (ref 135–145)
Sodium: 142 mmol/L (ref 135–145)
TCO2: 24 mmol/L (ref 22–32)
TCO2: 26 mmol/L (ref 22–32)
pCO2, Ven: 49.3 mmHg (ref 44–60)
pCO2, Ven: 50.2 mmHg (ref 44–60)
pH, Ven: 7.26 (ref 7.25–7.43)
pH, Ven: 7.305 (ref 7.25–7.43)
pO2, Ven: 31 mmHg — CL (ref 32–45)
pO2, Ven: 50 mmHg — ABNORMAL HIGH (ref 32–45)

## 2021-12-29 LAB — APTT
aPTT: 69 seconds — ABNORMAL HIGH (ref 24–36)
aPTT: >200 seconds (ref 24–36)
aPTT: 47 seconds — ABNORMAL HIGH (ref 24–36)

## 2021-12-29 LAB — MAGNESIUM
Magnesium: 2.2 mg/dL (ref 1.7–2.4)
Magnesium: 1.8 mg/dL (ref 1.7–2.4)

## 2021-12-29 LAB — GLOBAL TEG PANEL
CFF Max Amplitude: 14 mm — ABNORMAL LOW (ref 15–32)
CK with Heparinase (R): 12.5 min — ABNORMAL HIGH (ref 4.3–8.3)
Citrated Functional Fibrinogen: 255.5 mg/dL — ABNORMAL LOW (ref 278–581)
Citrated Kaolin (MA): <40 mm — ABNORMAL LOW (ref 52–69)
Citrated Kaolin (R): >17 min — ABNORMAL HIGH (ref 4.6–9.1)
Citrated Kaolin Angle: <39 deg — ABNORMAL LOW (ref 63–78)
Citrated Rapid TEG (MA): 51.1 mm — ABNORMAL LOW (ref 52–70)

## 2021-12-29 LAB — BASIC METABOLIC PANEL
Anion gap: 9 (ref 5–15)
BUN: 19 mg/dL (ref 6–20)
CO2: 21 mmol/L — ABNORMAL LOW (ref 22–32)
Calcium: 7.4 mg/dL — ABNORMAL LOW (ref 8.9–10.3)
Chloride: 112 mmol/L — ABNORMAL HIGH (ref 98–111)
Creatinine, Ser: 1.83 mg/dL — ABNORMAL HIGH (ref 0.61–1.24)
GFR, Estimated: 47 mL/min — ABNORMAL LOW (ref >60)
Glucose, Bld: 152 mg/dL — ABNORMAL HIGH (ref 70–99)
Potassium: 4.5 mmol/L (ref 3.5–5.1)
Sodium: 142 mmol/L (ref 135–145)

## 2021-12-29 LAB — HEMOGLOBIN AND HEMATOCRIT, BLOOD
HCT: 31 % — ABNORMAL LOW (ref 39.0–52.0)
HCT: 17.4 % — ABNORMAL LOW (ref 39.0–52.0)
Hemoglobin: 10.5 g/dL — ABNORMAL LOW (ref 13.0–17.0)
Hemoglobin: 6.2 g/dL — CL (ref 13.0–17.0)

## 2021-12-29 LAB — FIBRINOGEN
Fibrinogen: 163 mg/dL — ABNORMAL LOW (ref 210–475)
Fibrinogen: 159 mg/dL — ABNORMAL LOW (ref 210–475)
Fibrinogen: 216 mg/dL (ref 210–475)
Fibrinogen: 190 mg/dL — ABNORMAL LOW (ref 210–475)

## 2021-12-29 LAB — POCT ACTIVATED CLOTTING TIME
Activated Clotting Time: 0 seconds
Activated Clotting Time: 157 seconds
Activated Clotting Time: 191 seconds
Activated Clotting Time: 327 seconds
Activated Clotting Time: 448 seconds
Activated Clotting Time: 469 seconds
Activated Clotting Time: 483 seconds
Activated Clotting Time: 490 seconds
Activated Clotting Time: 516 seconds
Activated Clotting Time: 562 seconds
Activated Clotting Time: 662 seconds

## 2021-12-29 LAB — PLATELET COUNT
Platelets: 82 10*3/uL — ABNORMAL LOW (ref 150–400)
Platelets: 116 10*3/uL — ABNORMAL LOW (ref 150–400)

## 2021-12-29 LAB — LACTIC ACID, PLASMA: Lactic Acid, Venous: 4.7 mmol/L (ref 0.5–1.9)

## 2021-12-29 SURGERY — VENTRICULAR ASSIST DEVICE INSERTION
Anesthesia: LOCAL | Site: Groin

## 2021-12-29 MED ORDER — ALBUMIN HUMAN 5 % IV SOLN
250.0000 mL | INTRAVENOUS | Status: AC | PRN
  Administered 2021-12-29 (×2): 12.5 g via INTRAVENOUS
  Filled 2021-12-29 (×4): qty 250

## 2021-12-29 MED ORDER — MAGNESIUM SULFATE 4 GM/100ML IV SOLN
4.0000 g | Freq: Once | INTRAVENOUS | Status: DC
  Filled 2021-12-29: qty 100

## 2021-12-29 MED ORDER — CHLORHEXIDINE GLUCONATE CLOTH 2 % EX PADS
6.0000 | MEDICATED_PAD | Freq: Every day | CUTANEOUS | Status: DC
Start: 2021-12-29 — End: 2021-12-31
  Administered 2021-12-30 – 2021-12-31 (×2): 6 via TOPICAL

## 2021-12-29 MED ORDER — AMIODARONE HCL IN DEXTROSE 360-4.14 MG/200ML-% IV SOLN
60.0000 mg/h | INTRAVENOUS | Status: DC
  Administered 2021-12-29 – 2021-12-31 (×9): 60 mg/h via INTRAVENOUS
  Filled 2021-12-29 (×10): qty 200

## 2021-12-29 MED ORDER — SODIUM CHLORIDE 0.9 % FOR CRRT: INTRAVENOUS_CENTRAL | Status: DC | PRN

## 2021-12-29 MED ORDER — COAGULATION FACTOR VIIA RECOMB 1 MG IV SOLR
45.0000 ug/kg | INTRAVENOUS | Status: AC
  Administered 2021-12-29: 4000 ug via INTRAVENOUS
  Filled 2021-12-29: qty 4

## 2021-12-29 MED ORDER — MILRINONE LACTATE IN DEXTROSE 20-5 MG/100ML-% IV SOLN
0.5000 ug/kg/min | INTRAVENOUS | Status: DC
  Administered 2021-12-30 – 2021-12-31 (×5): 0.5 ug/kg/min via INTRAVENOUS
  Filled 2021-12-29 (×7): qty 100

## 2021-12-29 MED ORDER — SODIUM CHLORIDE 0.9 % IV SOLN: INTRAVENOUS | Status: DC

## 2021-12-29 MED ORDER — PROTAMINE SULFATE 10 MG/ML IV SOLN
INTRAVENOUS | Status: DC | PRN
  Administered 2021-12-29: 220 mg via INTRAVENOUS
  Administered 2021-12-29: 30 mg via INTRAVENOUS

## 2021-12-29 MED ORDER — SODIUM CHLORIDE 0.9 % IV SOLN: 250.0000 mL | INTRAVENOUS | Status: DC

## 2021-12-29 MED ORDER — INSULIN REGULAR(HUMAN) IN NACL 100-0.9 UT/100ML-% IV SOLN
INTRAVENOUS | Status: DC
  Filled 2021-12-29 (×2): qty 100

## 2021-12-29 MED ORDER — COAGULATION FACTOR VIIA RECOMB 1 MG IV SOLR
5000.0000 ug | Freq: Once | INTRAVENOUS | Status: AC
  Administered 2021-12-29: 5000 ug via INTRAVENOUS
  Filled 2021-12-29: qty 5

## 2021-12-29 MED ORDER — MORPHINE SULFATE (PF) 2 MG/ML IV SOLN: 1.0000 mg | INTRAVENOUS | Status: DC | PRN

## 2021-12-29 MED ORDER — BISACODYL 10 MG RE SUPP
10.0000 mg | Freq: Every day | RECTAL | Status: DC
  Administered 2021-12-30: 10 mg via RECTAL
  Filled 2021-12-29: qty 1

## 2021-12-29 MED ORDER — CEFAZOLIN SODIUM-DEXTROSE 2-4 GM/100ML-% IV SOLN
2.0000 g | Freq: Three times a day (TID) | INTRAVENOUS | Status: DC
  Administered 2021-12-29 – 2021-12-30 (×4): 2 g via INTRAVENOUS
  Filled 2021-12-29 (×4): qty 100

## 2021-12-29 MED ORDER — MIDAZOLAM-SODIUM CHLORIDE 100-0.9 MG/100ML-% IV SOLN
0.0000 mg/h | INTRAVENOUS | Status: DC
  Administered 2021-12-29: 1 mg/h via INTRAVENOUS
  Administered 2021-12-30: 10 mg/h via INTRAVENOUS
  Administered 2021-12-31: 4 mg/h via INTRAVENOUS
  Filled 2021-12-29 (×3): qty 100

## 2021-12-29 MED ORDER — CALCIUM CHLORIDE 10 % IV SOLN
INTRAVENOUS | Status: DC | PRN
  Administered 2021-12-29: 250 mg via INTRAVENOUS
  Administered 2021-12-29 (×5): 200 mg via INTRAVENOUS

## 2021-12-29 MED ORDER — LACTATED RINGERS IV SOLN: INTRAVENOUS | Status: DC

## 2021-12-29 MED ORDER — DEXTROSE 50 % IV SOLN
0.0000 mL | INTRAVENOUS | Status: DC | PRN
  Filled 2021-12-29: qty 50

## 2021-12-29 MED ORDER — PANTOPRAZOLE SODIUM 40 MG PO TBEC: 40.0000 mg | DELAYED_RELEASE_TABLET | Freq: Every day | ORAL | Status: DC

## 2021-12-29 MED ORDER — NOREPINEPHRINE 16 MG/250ML-% IV SOLN
0.0000 ug/min | INTRAVENOUS | Status: DC
  Administered 2021-12-29: 22 ug/min via INTRAVENOUS
  Administered 2021-12-29: 40 ug/min via INTRAVENOUS
  Administered 2021-12-30: 29.973 ug/min via INTRAVENOUS
  Administered 2021-12-31 (×2): 50 ug/min via INTRAVENOUS
  Filled 2021-12-29 (×9): qty 250

## 2021-12-29 MED ORDER — DOCUSATE SODIUM 50 MG/5ML PO LIQD
100.0000 mg | Freq: Two times a day (BID) | ORAL | Status: DC
  Administered 2021-12-30: 100 mg
  Filled 2021-12-29 (×2): qty 10

## 2021-12-29 MED ORDER — ASPIRIN 325 MG PO TBEC: 325.0000 mg | DELAYED_RELEASE_TABLET | Freq: Every day | ORAL | Status: DC

## 2021-12-29 MED ORDER — METOPROLOL TARTRATE 5 MG/5ML IV SOLN: 2.5000 mg | INTRAVENOUS | Status: DC | PRN

## 2021-12-29 MED ORDER — ALBUMIN HUMAN 25 % IV SOLN
INTRAVENOUS | Status: AC
  Filled 2021-12-29: qty 100

## 2021-12-29 MED ORDER — MIDAZOLAM HCL 2 MG/2ML IJ SOLN
INTRAMUSCULAR | Status: AC
  Filled 2021-12-29: qty 2

## 2021-12-29 MED ORDER — ASPIRIN 81 MG PO CHEW: 324.0000 mg | CHEWABLE_TABLET | Freq: Every day | ORAL | Status: DC

## 2021-12-29 MED ORDER — SODIUM CHLORIDE 0.9 % IV SOLN: 50.0000 mg | Freq: Once | INTRAVENOUS | Status: DC

## 2021-12-29 MED ORDER — LIDOCAINE HCL (PF) 1 % IJ SOLN
INTRAMUSCULAR | Status: DC | PRN
  Administered 2021-12-29: 15 mL

## 2021-12-29 MED ORDER — FENTANYL BOLUS VIA INFUSION: 50.0000 ug | INTRAVENOUS | Status: DC | PRN

## 2021-12-29 MED ORDER — HEMOSTATIC AGENTS (NO CHARGE) OPTIME
TOPICAL | Status: DC | PRN
  Administered 2021-12-29: 1 via TOPICAL

## 2021-12-29 MED ORDER — METHYLPREDNISOLONE SODIUM SUCC 125 MG IJ SOLR
INTRAMUSCULAR | Status: DC | PRN
  Administered 2021-12-29: 125 mg via INTRAVENOUS

## 2021-12-29 MED ORDER — SODIUM BICARBONATE 8.4 % IV SOLN
INTRAVENOUS | Status: DC
  Filled 2021-12-29: qty 25

## 2021-12-29 MED ORDER — EPHEDRINE SULFATE (PRESSORS) 50 MG/ML IJ SOLN
INTRAMUSCULAR | Status: DC | PRN
  Administered 2021-12-29: 10 mg via INTRAVENOUS

## 2021-12-29 MED ORDER — POTASSIUM CHLORIDE 10 MEQ/50ML IV SOLN
10.0000 meq | Freq: Once | INTRAVENOUS | Status: AC
Start: 2021-12-29 — End: 2021-12-29
  Administered 2021-12-29: 10 meq via INTRAVENOUS

## 2021-12-29 MED ORDER — DEXTROSE 5 % SOLN FOR IMPELLA PURGE CATHETER
INTRAVENOUS | Status: DC
  Filled 2021-12-29: qty 1000

## 2021-12-29 MED ORDER — MIDAZOLAM BOLUS VIA INFUSION: 0.0000 mg | INTRAVENOUS | Status: DC | PRN

## 2021-12-29 MED ORDER — PROPOFOL 10 MG/ML IV BOLUS
INTRAVENOUS | Status: DC | PRN
  Administered 2021-12-29: 100 mg via INTRAVENOUS

## 2021-12-29 MED ORDER — COAGULATION FACTOR VIIA RECOMB 1 MG IV SOLR
90.0000 ug/kg | Freq: Once | INTRAVENOUS | Status: AC
  Administered 2021-12-29: 8000 ug via INTRAVENOUS
  Filled 2021-12-29: qty 1

## 2021-12-29 MED ORDER — BISACODYL 5 MG PO TBEC: 10.0000 mg | DELAYED_RELEASE_TABLET | Freq: Every day | ORAL | Status: DC

## 2021-12-29 MED ORDER — ACETAMINOPHEN 500 MG PO TABS: 1000.0000 mg | ORAL_TABLET | Freq: Four times a day (QID) | ORAL | Status: DC

## 2021-12-29 MED ORDER — EPINEPHRINE HCL 5 MG/250ML IV SOLN IN NS
0.5000 ug/min | INTRAVENOUS | Status: DC
  Administered 2021-12-29 – 2021-12-30 (×4): 30 ug/min via INTRAVENOUS
  Administered 2021-12-30: 20 ug/min via INTRAVENOUS
  Administered 2021-12-30: 50 ug/min via INTRAVENOUS
  Administered 2021-12-30: 26 ug/min via INTRAVENOUS
  Administered 2021-12-31 (×2): 30 ug/min via INTRAVENOUS
  Filled 2021-12-29 (×2): qty 250
  Filled 2021-12-29: qty 500
  Filled 2021-12-29: qty 250
  Filled 2021-12-29: qty 500
  Filled 2021-12-29 (×5): qty 250

## 2021-12-29 MED ORDER — POTASSIUM CHLORIDE 10 MEQ/50ML IV SOLN
10.0000 meq | INTRAVENOUS | Status: AC
  Filled 2021-12-29: qty 50

## 2021-12-29 MED ORDER — SODIUM CHLORIDE 0.9% IV SOLUTION: Freq: Once | INTRAVENOUS | Status: DC

## 2021-12-29 MED ORDER — CHLORHEXIDINE GLUCONATE 0.12 % MT SOLN: 15.0000 mL | OROMUCOSAL | Status: DC

## 2021-12-29 MED ORDER — ONDANSETRON HCL 4 MG/2ML IJ SOLN: 4.0000 mg | Freq: Four times a day (QID) | INTRAMUSCULAR | Status: DC | PRN

## 2021-12-29 MED ORDER — CALCIUM CHLORIDE 10 % IV SOLN
0.5000 g | Freq: Once | INTRAVENOUS | Status: AC
  Administered 2021-12-29: 0.5 g via INTRAVENOUS

## 2021-12-29 MED ORDER — EPINEPHRINE HCL 5 MG/250ML IV SOLN IN NS
0.5000 ug/min | INTRAVENOUS | Status: DC
  Administered 2021-12-29: 14 ug/min via INTRAVENOUS
  Filled 2021-12-29 (×2): qty 250

## 2021-12-29 MED ORDER — ACETAMINOPHEN 160 MG/5ML PO SOLN
1000.0000 mg | Freq: Four times a day (QID) | ORAL | Status: DC
  Administered 2021-12-30: 1000 mg
  Filled 2021-12-29 (×4): qty 40.6

## 2021-12-29 MED ORDER — ACETAMINOPHEN 160 MG/5ML PO SOLN: 650.0000 mg | Freq: Once | ORAL | Status: DC

## 2021-12-29 MED ORDER — HEPARIN (PORCINE) IN NACL 1000-0.9 UT/500ML-% IV SOLN
INTRAVENOUS | Status: DC | PRN
  Administered 2021-12-29 (×2): 500 mL

## 2021-12-29 MED ORDER — ALBUMIN HUMAN 5 % IV SOLN: INTRAVENOUS | Status: DC | PRN

## 2021-12-29 MED ORDER — HEPARIN SODIUM (PORCINE) 1000 UNIT/ML DIALYSIS
1000.0000 [IU] | INTRAMUSCULAR | Status: DC | PRN
  Administered 2021-12-31 (×2): 1400 [IU] via INTRAVENOUS_CENTRAL
  Filled 2021-12-29 (×2): qty 6
  Filled 2021-12-29: qty 1
  Filled 2021-12-29: qty 4
  Filled 2021-12-29: qty 6
  Filled 2021-12-29: qty 2
  Filled 2021-12-29: qty 6

## 2021-12-29 MED ORDER — PRISMASOL BGK 4/2.5 32-4-2.5 MEQ/L REPLACEMENT SOLN: Status: DC

## 2021-12-29 MED ORDER — HEPARIN SODIUM (PORCINE) 1000 UNIT/ML IJ SOLN
INTRAMUSCULAR | Status: AC
  Filled 2021-12-29: qty 10

## 2021-12-29 MED ORDER — SODIUM CHLORIDE 0.9 % IV SOLN: INTRAVENOUS | Status: DC | PRN

## 2021-12-29 MED ORDER — COAGULATION FACTOR VIIA RECOMB 1 MG IV SOLR
45.0000 ug/kg | Freq: Once | INTRAVENOUS | Status: DC
  Filled 2021-12-29: qty 4

## 2021-12-29 MED ORDER — NITROGLYCERIN IN D5W 200-5 MCG/ML-% IV SOLN
0.0000 ug/min | INTRAVENOUS | Status: DC
  Filled 2021-12-29: qty 250

## 2021-12-29 MED ORDER — PRISMASOL BGK 4/2.5 32-4-2.5 MEQ/L EC SOLN: Status: DC

## 2021-12-29 MED ORDER — VASOPRESSIN 20 UNITS/100 ML INFUSION FOR SHOCK
0.0000 [IU]/min | INTRAVENOUS | Status: DC
  Administered 2021-12-29 – 2021-12-30 (×2): 0.03 [IU]/min via INTRAVENOUS
  Administered 2021-12-30: 0.08 [IU]/min via INTRAVENOUS
  Administered 2021-12-30: 0.03 [IU]/min via INTRAVENOUS
  Administered 2021-12-31: 0.05 [IU]/min via INTRAVENOUS
  Administered 2021-12-31: 0.08 [IU]/min via INTRAVENOUS
  Filled 2021-12-29 (×6): qty 100
  Filled 2021-12-29: qty 200
  Filled 2021-12-29: qty 100

## 2021-12-29 MED ORDER — SODIUM CHLORIDE 0.9% FLUSH
3.0000 mL | INTRAVENOUS | Status: DC | PRN
  Administered 2021-12-31: 3 mL via INTRAVENOUS

## 2021-12-29 MED ORDER — HEPARIN (PORCINE) IN NACL 1000-0.9 UT/500ML-% IV SOLN
INTRAVENOUS | Status: AC
  Filled 2021-12-29: qty 500

## 2021-12-29 MED ORDER — OXYCODONE HCL 5 MG PO TABS: 5.0000 mg | ORAL_TABLET | ORAL | Status: DC | PRN

## 2021-12-29 MED ORDER — SODIUM CHLORIDE 0.9% FLUSH
3.0000 mL | Freq: Two times a day (BID) | INTRAVENOUS | Status: DC
  Administered 2021-12-30: 3 mL via INTRAVENOUS

## 2021-12-29 MED ORDER — PHENYLEPHRINE HCL-NACL 20-0.9 MG/250ML-% IV SOLN
0.0000 ug/min | INTRAVENOUS | Status: DC
  Filled 2021-12-29: qty 250

## 2021-12-29 MED ORDER — VANCOMYCIN HCL IN DEXTROSE 1-5 GM/200ML-% IV SOLN
1000.0000 mg | Freq: Once | INTRAVENOUS | Status: AC
Start: 2021-12-29 — End: 2021-12-29
  Administered 2021-12-29: 1000 mg via INTRAVENOUS
  Filled 2021-12-29: qty 200

## 2021-12-29 MED ORDER — POLYETHYLENE GLYCOL 3350 17 G PO PACK
17.0000 g | PACK | Freq: Every day | ORAL | Status: DC
  Administered 2021-12-30: 17 g
  Filled 2021-12-29: qty 1

## 2021-12-29 MED ORDER — TRANEXAMIC ACID 1000 MG/10ML IV SOLN
1.5000 mg/kg/h | INTRAVENOUS | Status: DC
  Filled 2021-12-29: qty 25

## 2021-12-29 MED ORDER — DOCUSATE SODIUM 100 MG PO CAPS: 200.0000 mg | ORAL_CAPSULE | Freq: Every day | ORAL | Status: DC

## 2021-12-29 MED ORDER — FENTANYL 2500MCG IN NS 250ML (10MCG/ML) PREMIX INFUSION
50.0000 ug/h | INTRAVENOUS | Status: DC
  Administered 2021-12-29: 50 ug/h via INTRAVENOUS
  Administered 2021-12-29: 250 ug/h via INTRAVENOUS
  Administered 2021-12-30: 350 ug/h via INTRAVENOUS
  Administered 2021-12-30: 400 ug/h via INTRAVENOUS
  Administered 2021-12-30 – 2021-12-31 (×3): 300 ug/h via INTRAVENOUS
  Filled 2021-12-29 (×7): qty 250

## 2021-12-29 MED ORDER — METOPROLOL TARTRATE 25 MG/10 ML ORAL SUSPENSION: 12.5000 mg | Freq: Two times a day (BID) | ORAL | Status: DC

## 2021-12-29 MED ORDER — LIDOCAINE HCL (PF) 1 % IJ SOLN
INTRAMUSCULAR | Status: AC
  Filled 2021-12-29: qty 30

## 2021-12-29 MED ORDER — DEXMEDETOMIDINE HCL IN NACL 400 MCG/100ML IV SOLN
0.4000 ug/kg/h | INTRAVENOUS | Status: DC
  Administered 2021-12-30: 1 ug/kg/h via INTRAVENOUS
  Administered 2021-12-30 – 2021-12-31 (×4): 0.7 ug/kg/h via INTRAVENOUS
  Filled 2021-12-29 (×9): qty 100

## 2021-12-29 MED ORDER — VASOPRESSIN 20 UNITS/100 ML INFUSION FOR SHOCK
0.0000 [IU]/min | INTRAVENOUS | Status: AC
  Administered 2021-12-29: .03 [IU]/min via INTRAVENOUS
  Filled 2021-12-29: qty 100

## 2021-12-29 MED ORDER — SODIUM CHLORIDE 0.9 % IV SOLN
20.0000 ug | Freq: Once | INTRAVENOUS | Status: AC
  Administered 2021-12-29: 20 ug via INTRAVENOUS
  Filled 2021-12-29: qty 5

## 2021-12-29 MED ORDER — METOPROLOL TARTRATE 12.5 MG HALF TABLET: 12.5000 mg | ORAL_TABLET | Freq: Two times a day (BID) | ORAL | Status: DC

## 2021-12-29 MED ORDER — LACTATED RINGERS IV SOLN: 500.0000 mL | Freq: Once | INTRAVENOUS | Status: DC | PRN

## 2021-12-29 MED ORDER — NOREPINEPHRINE 4 MG/250ML-% IV SOLN
0.0000 ug/min | INTRAVENOUS | Status: DC
  Administered 2021-12-29: 22 ug/min via INTRAVENOUS
  Filled 2021-12-29: qty 250

## 2021-12-29 MED ORDER — SODIUM CHLORIDE 0.9% IV SOLUTION: INTRAVENOUS | Status: DC | PRN

## 2021-12-29 MED ORDER — IOHEXOL 350 MG/ML SOLN
INTRAVENOUS | Status: DC | PRN
  Administered 2021-12-29: 20 mL

## 2021-12-29 MED ORDER — SODIUM CHLORIDE 0.9% IV SOLUTION: Freq: Once | INTRAVENOUS | Status: AC

## 2021-12-29 MED ORDER — SODIUM CHLORIDE 0.45 % IV SOLN: INTRAVENOUS | Status: DC | PRN

## 2021-12-29 MED ORDER — DEXTROSE 5 % IV SOLN
INTRAVENOUS | Status: DC
  Filled 2021-12-29 (×2): qty 10

## 2021-12-29 MED ORDER — FENTANYL CITRATE PF 50 MCG/ML IJ SOSY: 50.0000 ug | PREFILLED_SYRINGE | Freq: Once | INTRAMUSCULAR | Status: DC

## 2021-12-29 MED ORDER — MAGNESIUM SULFATE 2 GM/50ML IV SOLN
2.0000 g | Freq: Once | INTRAVENOUS | Status: AC
Start: 2021-12-29 — End: 2021-12-30
  Administered 2021-12-29: 2 g via INTRAVENOUS
  Filled 2021-12-29: qty 50

## 2021-12-29 MED ORDER — FAMOTIDINE IN NACL 20-0.9 MG/50ML-% IV SOLN
20.0000 mg | Freq: Two times a day (BID) | INTRAVENOUS | Status: AC
  Administered 2021-12-30: 20 mg via INTRAVENOUS
  Filled 2021-12-29: qty 50

## 2021-12-29 MED ORDER — SODIUM CHLORIDE 0.9 % IV SOLN
0.5000 ug/min | INTRAVENOUS | Status: DC
  Filled 2021-12-29: qty 10

## 2021-12-29 MED ORDER — ALBUMIN HUMAN 5 % IV SOLN
INTRAVENOUS | Status: AC
  Administered 2021-12-30: 12.5 g
  Filled 2021-12-29: qty 250

## 2021-12-29 MED ORDER — FUROSEMIDE 10 MG/ML IJ SOLN
40.0000 mg | Freq: Once | INTRAMUSCULAR | Status: AC
  Administered 2021-12-29: 40 mg via INTRAVENOUS
  Filled 2021-12-29: qty 4

## 2021-12-29 MED ORDER — ALTEPLASE 2 MG IJ SOLR
2.0000 mg | Freq: Once | INTRAMUSCULAR | Status: AC | PRN
  Administered 2021-12-31: 2 mg
  Filled 2021-12-29 (×2): qty 2

## 2021-12-29 MED ORDER — TRAMADOL HCL 50 MG PO TABS: 50.0000 mg | ORAL_TABLET | ORAL | Status: DC | PRN

## 2021-12-29 MED ORDER — MIDAZOLAM HCL 2 MG/2ML IJ SOLN
2.0000 mg | INTRAMUSCULAR | Status: DC | PRN
  Filled 2021-12-29: qty 2

## 2021-12-29 MED ORDER — ACETAMINOPHEN 650 MG RE SUPP: 650.0000 mg | Freq: Once | RECTAL | Status: DC

## 2021-12-29 SURGICAL SUPPLY — 7 items
CATH SWAN GANZ 7F STRAIGHT (CATHETERS) ×1 IMPLANT
INTRODUCER 7FR 23CM (INTRODUCER) ×1 IMPLANT
MAT PREVALON FULL STRYKER (MISCELLANEOUS) ×1 IMPLANT
PUMP SET IMPELLA RP US (CATHETERS) ×1 IMPLANT
SHEATH PINNACLE 7F 10CM (SHEATH) ×1 IMPLANT
SHEATH PROBE COVER 6X72 (BAG) ×1 IMPLANT
WIRE EMERALD 3MM-J .025X260CM (WIRE) ×1 IMPLANT

## 2021-12-29 NOTE — Consult Note (Signed)
Renal Service Consult Note [facility]  [name] 01/04/2022 Maree Krabbe, MD Requesting Physician: Dr Gala Romney  Reason for Consult: Renal failure HPI: The patient is a 41 y.o. year-old w/ hx of HTN, MI/ CAD, PVD and tobacco use who presented to ED 6/16 w/ chest pain. Hx of recent stent placement. Pt had abnormal EKG and severe chest pain and was taken to cath lab which showed patent stents to LAD and LCX, EF 25%. CTA then done showed type A aortic dissection. L renal artery was perfused off the false lumen. Creat 1.8.  Pt went to OR for repair early today per TCTS.  Pt seen by CHF team and LV and RV impellas were placed due to bivent CHF w/ cardiogenic shock. Is now on multiple pressors and milrinone, coagulopathic and getting cryo and factor 7. Asked to see possible need for CRRT.   Pt seen in room. No hx obtained.   ROS - n/a   Past Medical History  Past Medical History:  Diagnosis Date   CAD (coronary artery disease)    Heart attack (HCC)    Hypertension    PVD (peripheral vascular disease) (HCC)    Tobacco abuse    Past Surgical History  Past Surgical History:  Procedure Laterality Date   PERIPHERAL VASCULAR CATHETERIZATION N/A 07/18/2015   Procedure: Abdominal Aortogram;  Surgeon: Yates Decamp, MD;  Location: [facility] INVASIVE CV LAB;  Service: Cardiovascular;  Laterality: N/A;   PERIPHERAL VASCULAR CATHETERIZATION  07/18/2015   Procedure: Lower Extremity Angiography;  Surgeon: Yates Decamp, MD;  Location: [facility] INVASIVE CV LAB;  Service: Cardiovascular;;   PERIPHERAL VASCULAR CATHETERIZATION  07/18/2015   Procedure: Peripheral Vascular Intervention;  Surgeon: Yates Decamp, MD;  Location: [facility] INVASIVE CV LAB;  Service: Cardiovascular;;  RIGHT COMMON ILIAC   Family History  Family History  Problem Relation Age of Onset   CAD Neg Hx    Social History  reports that he has been smoking. He does not have any smokeless tobacco history on file. He reports current drug use. Drug:  Marijuana. He reports that he does not drink alcohol. Allergies No Known Allergies Home medications Prior to Admission medications   Medication Sig Start Date End Date Taking? Authorizing Provider  aspirin EC 81 MG tablet Take 1 tablet (81 mg total) by mouth daily. 07/18/15   Yates Decamp, MD  carvedilol (COREG) 12.5 MG tablet Take 1 tablet (12.5 mg total) by mouth 2 (two) times daily with a meal. 07/18/15   Yates Decamp, MD  clopidogrel (PLAVIX) 75 MG tablet Take 1 tablet (75 mg total) by mouth daily. 07/18/15   Yates Decamp, MD     Vitals:   01/11/2022 1700 12/22/2021 1715 12/19/2021 1730 12/28/2021 1745  BP:      Pulse:      Resp: 15 15 15 16   Temp: (!) 94.5 F (34.7 C) (!) 94.5 F (34.7 C) (!) 93.2 F (34 C) (!) 92.7 F (33.7 C)  TempSrc:      SpO2:      Weight:      Height:       Exam Gen on vent, sedated No rash, cyanosis or gangrene Sclera anicteric, throat w/ ETT No jvd or bruits Chest clear anterior/ lateral, bilat chest tubes in place RRR no RG Abd soft ntnd no mass or ascites +bs GU normal male w/ foley cath MS no joint effusions or deformity Ext mild diffuse 1+ edema Neuro is on vent, sedated   Home meds include -  aspirin, carvedilol 12.5 bid, clopidogrel    UA - not done    UNa, UCr - not done     CXR 6/17 - ET tube tip is at the thoracic inlet. Impella device overlies the expected area of the right ventricle. Mediastinal drains in place. Cardiac and mediastinal contours within normal limits. Small layering right pleural effusion. No evidence of pneumothorax.     CT chest /abd w/ 100 cc contrast - IMPRESSION: Type A aortic dissection with wide communication between the true and false lumens. The dissection flap extends into the arch vasculature and subsequently into the right common carotid and right subclavian arteries. The dissection flap extends into the celiac axis and common hepatic artery. The dissection flap extends into the superior mesenteric artery and there is  occlusion of the mid and distal superior mesenteric artery. The left renal artery and IMA arise from the false lumen and are widely patent. The ascending aorta is mildly dilated measuring 4.5 cm in maximal diameter.      ABG 7.4/ 31/ 279     Hb 10/ Hct 31%    Na 142  K 4.5 CO2 21  BUN 19  cr 1.83  glu 152  AG 9     Ca 7.4  Mg 2.2  LDH 1130   WBC 8k  Hb 10.7 plt 86     Assessment/ Plan: AKI - due to cardiogenic shock in setting of acute aortic dissection. Asked to see for possible CRRT.  CRRT orders are in if needed for solute control, metabolic acidosis or other.  Type A aortic dissection - sp repair early this am Bivent HF w/ cardiogenic shock - on 3 pressors + milrinone, RV and LV impella devices in place. Per CHF team.  CAD - hx LAD and LCx stents      Rob Randee Upchurch  MD 01/07/22, 6:15 PM Recent Labs  Lab 12/21/2021 1645 12/13/2021 1716 01/04/2022 1955 12/27/2021 2137 Jan 07, 2022 0656 07-Jan-2022 0736 January 07, 2022 0945 01-07-22 1453 07-Jan-2022 1614  HGB 11.9*   < >  --    < > 9.2*   < > 9.2*  10.7* 6.5* 10.5*  ALBUMIN 4.1  --   --   --   --   --   --   --   --   CALCIUM 9.1  --  9.1  --   --   --  7.4*  --   --   CREATININE 1.89*   < > 1.94*   < > 1.40*  --  1.83*  --   --   K 3.7   < > 2.9*   < > 4.4   < > 4.5  4.5 4.5  --    < > = values in this interval not displayed.

## 2021-12-29 NOTE — Transfer of Care (Signed)
Immediate Anesthesia Transfer of Care Note  Patient: [name]  Procedure(s) Performed: REPAIR TYPE A DISSECTION PLACEMENT OF IMPELLA LEFT VENTRICULAR ASSIST DEVICE (Chest)  Patient Location: PACU  Anesthesia Type:General  Level of Consciousness: unresponsive and Patient remains intubated per anesthesia plan  Airway & Oxygen Therapy: Patient remains intubated per anesthesia plan and Patient placed on Ventilator (see vital sign flow sheet for setting)  Post-op Assessment: Report given to RN and Post -op Vital signs reviewed and stable  Post vital signs: Reviewed and stable  Last Vitals:  Vitals Value Taken Time  BP    Temp 36.1 C 01/09/2022 0941  Pulse 92 12/27/2021 0941  Resp 15 12/17/2021 0941  SpO2 100 % 01/07/2022 0941  Vitals shown include unvalidated device data.  Last Pain:  Vitals:   01/10/2022 1736  PainSc: 10-Worst pain ever         Complications: No notable events documented.

## 2021-12-29 NOTE — Interval H&P Note (Signed)
History and Physical Interval Note:  01/04/2022 3:38 PM  [name]  has presented today for surgery, with the diagnosis of heart failure.  The various methods of treatment have been discussed with the patient and family. After consideration of risks, benefits and other options for treatment, the patient has consented to  Procedure(s): VENTRICULAR ASSIST DEVICE INSERTION (N/A) and insertion of temporary dialysis cath as a surgical intervention.  The patient's history has been reviewed, patient examined, no change in status, stable for surgery.  I have reviewed the patient's chart and labs.  Questions were answered to the patient's satisfaction.     [name]

## 2021-12-29 NOTE — Progress Notes (Incomplete)
Progress Note  Patient Name: [name] Date of Encounter: 12/27/2021  [facility] Cardiologist: None ***  Subjective   ***  Inpatient Medications    Scheduled Meds:  sodium chloride   Intravenous Once   sodium chloride   Intravenous Once   [START ON 12/27/2021] acetaminophen  1,000 mg Oral Q6H   Or   [START ON 12/14/2021] acetaminophen (TYLENOL) oral liquid 160 mg/5 mL  1,000 mg Per Tube Q6H   acetaminophen (TYLENOL) oral liquid 160 mg/5 mL  650 mg Per Tube Once   Or   acetaminophen  650 mg Rectal Once   [START ON 12/27/2021] aspirin EC  325 mg Oral Daily   Or   [START ON 12/22/2021] aspirin  324 mg Per Tube Daily   [START ON 01/11/2022] bisacodyl  10 mg Oral Daily   Or   [START ON 12/16/2021] bisacodyl  10 mg Rectal Daily   chlorhexidine  15 mL Mouth/Throat NOW   Chlorhexidine Gluconate Cloth  6 each Topical Daily   docusate  100 mg Per Tube BID   [START ON 12/27/2021] docusate sodium  200 mg Oral Daily   fentaNYL (SUBLIMAZE) injection  50 mcg Intravenous Once   metoprolol tartrate  12.5 mg Oral BID   Or   metoprolol tartrate  12.5 mg Per Tube BID   [START ON 01-06-2022] pantoprazole  40 mg Oral Daily   polyethylene glycol  17 g Per Tube Daily   [START ON 01/05/2022] sodium chloride flush  3 mL Intravenous Q12H   Continuous Infusions:  sodium chloride     [START ON 01/11/2022] sodium chloride     sodium chloride     albumin human     amiodarone      ceFAZolin (ANCEF) IV     dexmedetomidine (PRECEDEX) IV infusion 0.7 mcg/kg/hr (01/07/2022 0951)   dextrose 5 % (Impella PURGE) solution 1000 mL bag     epinephrine     famotidine (PEPCID) IV     fentaNYL infusion INTRAVENOUS 50 mcg/hr (12/26/2021 1042)   heparin 50 units/mL (Impella PURGE) in dextrose 5% 1000 mL     insulin     lactated ringers     lactated ringers     lactated ringers     magnesium sulfate     midazolam     nitroGLYCERIN     norepinephrine (LEVOPHED) Adult infusion     phenylephrine  (NEO-SYNEPHRINE) Adult infusion     potassium chloride     vancomycin     vasopressin     PRN Meds: sodium chloride, sodium chloride, albumin human, dextrose, fentaNYL, lactated ringers, metoprolol tartrate, midazolam, midazolam, morphine injection, ondansetron (ZOFRAN) IV, oxyCODONE, [START ON 12/26/2021] sodium chloride flush, traMADol   Vital Signs    Vitals:   12/24/2021 0951 12/14/2021 1000 01/04/2022 1015 01/01/2022 1030  BP:  104/85    Pulse: 90 90 89 (!) 175  Resp: 15 15 17 15   Temp: (!) 97 F (36.1 C) (!) 97 F (36.1 C) (!) 97.2 F (36.2 C) (!) 97.2 F (36.2 C)  SpO2: 100% 100% 91% 100%  Weight:      Height:        Intake/Output Summary (Last 24 hours) at 01/02/2022 1132 Last data filed at 12/17/2021 0918 Gross per 24 hour  Intake 8736.56 ml  Output 4171 ml  Net 4565.56 ml      12/27/2021    5:00 PM 07/18/2015    7:19 AM  Last 3 Weights  Weight (lbs) 185  lb 195 lb  Weight (kg) 83.915 kg 88.451 kg      Telemetry    *** - Personally Reviewed  ECG    *** - Personally Reviewed  Physical Exam  *** GEN: No acute distress.   Neck: No JVD Cardiac: RRR, no murmurs, rubs, or gallops.  Respiratory: Clear to auscultation bilaterally. GI: Soft, nontender, non-distended  MS: No edema; No deformity. Neuro:  Nonfocal  Psych: Normal affect   Labs    High Sensitivity Troponin:   Recent Labs  Lab 01/11/2022 1645 12/19/2021 1955  TROPONINIHS 54* 64*     Chemistry Recent Labs  Lab 12/20/2021 1645 12/15/2021 1716 12/27/2021 1955 12/21/2021 2137 01/06/2022 0533 12/26/2021 0652 12/21/2021 0656 12/26/2021 0736 12/19/2021 0945  NA 141   < > 138   < > 143   < > 143 144 142  K 3.7   < > 2.9*   < > 4.0   < > 4.4 4.5 4.5  CL 109   < > 105   < > 102  --  106  --  112*  CO2 22  --  17*  --   --   --   --   --  21*  GLUCOSE 119*   < > 230*   < > 185*  --  150*  --  152*  BUN 23*   < > 21*   < > 20  --  19  --  19  CREATININE 1.89*   < > 1.94*   < > 1.40*  --  1.40*  --  1.83*  CALCIUM 9.1   --  9.1  --   --   --   --   --  7.4*  MG  --   --   --   --   --   --   --   --  2.2  PROT 6.5  --   --   --   --   --   --   --   --   ALBUMIN 4.1  --   --   --   --   --   --   --   --   AST 17  --   --   --   --   --   --   --   --   ALT 14  --   --   --   --   --   --   --   --   ALKPHOS 59  --   --   --   --   --   --   --   --   BILITOT 1.1  --   --   --   --   --   --   --   --   GFRNONAA 45*  --  44*  --   --   --   --   --  47*  ANIONGAP 10  --  16*  --   --   --   --   --  9   < > = values in this interval not displayed.    Lipids  Recent Labs  Lab 01/06/2022 1645  CHOL 170  TRIG 64  HDL 48  LDLCALC 109*  CHOLHDL 3.5    Hematology Recent Labs  Lab 12/26/2021 1645 01/04/2022 1716 12/26/2021 0255 01/08/2022 0322 12/19/2021 0656 01/08/2022 0736 01/11/2022 0945  WBC 12.9*  --  6.2  --   --   --  8.7  RBC 4.00*  --  3.05*  --   --   --  3.55*  HGB 11.9*   < > 9.3*   < > 9.2* 7.1* 10.7*  HCT 36.1*   < > 27.1*   < > 27.0* 21.0* 30.5*  MCV 90.3  --  88.9  --   --   --  85.9  MCH 29.8  --  30.5  --   --   --  30.1  MCHC 33.0  --  34.3  --   --   --  35.1  RDW 13.8  --  14.2  --   --   --  15.1  PLT 231  --  82*  --   --   --  86*   < > = values in this interval not displayed.   Thyroid No results for input(s): "TSH", "FREET4" in the last 168 hours.  BNPNo results for input(s): "BNP", "PROBNP" in the last 168 hours.  DDimer No results for input(s): "DDIMER" in the last 168 hours.   Radiology    DG Chest Port 1 View  Result Date: 01/10/2022 CLINICAL DATA:  Aortic dissection EXAM: PORTABLE CHEST 1 VIEW COMPARISON:  Chest x-ray dated March 25, 2011 FINDINGS: ET tube tip is at the thoracic inlet, approximally 7.5 cm from the carina. Right IJ approach PA catheter projects over the expected area of the main pulmonary artery. Impella device overlies the expected area of the right ventricle. Gastric decompression tube tip and side port project over the expected area of the stomach.  Mediastinal drains in place. Cardiac and mediastinal contours within normal limits. Small layering right pleural effusion. No evidence of pneumothorax. IMPRESSION: 1. ET tube tip is at the thoracic inlet, approximally 7.5 cm from the carina. 2. Small layering right pleural effusion. Electronically Signed   By: Allegra LaiLeah  Strickland M.D.   On: 12/20/2021 10:12   CT Angio Chest/Abd/Pel for Dissection W and/or W/WO  Result Date: 12/19/2021 CLINICAL DATA:  Chest pain or back pain, aortic dissection suspected. Diaphoresis, chest pain EXAM: CT ANGIOGRAPHY CHEST, ABDOMEN AND PELVIS TECHNIQUE: Multidetector CT imaging through the chest, abdomen and pelvis was performed using the standard protocol during bolus administration of intravenous contrast. Multiplanar reconstructed images and MIPs were obtained and reviewed to evaluate the vascular anatomy. RADIATION DOSE REDUCTION: This exam was performed according to the departmental dose-optimization program which includes automated exposure control, adjustment of the mA and/or kV according to patient size and/or use of iterative reconstruction technique. CONTRAST:  100mL OMNIPAQUE IOHEXOL 350 MG/ML SOLN COMPARISON:  None Available. FINDINGS: CTA CHEST FINDINGS Cardiovascular: There is a type a dissection present with the dissection flap demonstrating wide communication between the true and false lumens, best seen on image # 79/8 and with the dissection flap extending inferiorly to involve the lateral aspect of the right coronary artery cusp and non coronary artery cusp. There is no pericardial effusion. The dissection flap extends to involve the arch vasculature and extends into the innominate artery and subsequently into the right common carotid and right subclavian arteries. The left common carotid artery and subclavian artery arise from the true lumen. Within the descending aorta, the true lumen is compressed and is nearly obliterated, best appreciated at image # 71 and #  137. The ascending aorta is mildly dilated measuring 4.5 cm in maximal diameter. The descending thoracic aorta is of normal caliber. Left anterior descending and circumflex coronary artery stenting has been performed. Global cardiac size is  mildly enlarged. Central pulmonary arteries are poorly opacified. The central pulmonary arteries are of normal caliber. Mediastinum/Nodes: There is infiltration of the mediastinal fat which may relate to interstitial hemorrhage or edema related to aortic dissection. There is, however, no mass formed hematoma identified, however. Visualized thyroid is unremarkable. Esophagus is unremarkable. No pathologic thoracic adenopathy. Lungs/Pleura: Lungs are clear. No pleural effusion or pneumothorax. Musculoskeletal: No acute bone abnormality Review of the MIP images confirms the above findings. CTA ABDOMEN AND PELVIS FINDINGS VASCULAR Aorta: The dissection flap extends through the abdominal aorta and the true lumen is nearly obliterated at the diaphragmatic hiatus, as mentioned above and is subsequently occluded just proximal to the aortic bifurcation. No aneurysm. No periaortic inflammatory change or hemorrhage. Celiac: The dissection flap extends into the celiac axis and common hepatic artery. No aneurysm. Widely patent distally. SMA: The dissection flap extends into the superior mesenteric artery and there is occlusion of the mid and distal superior mesenteric artery seen on axial image # 194-196. Only the first jejunal branch is opacified. Renals: Right renal artery arises from the true lumen. Left renal artery arises from the false lumen. Both vessels are widely patent and there is symmetric enhancement of the kidneys. IMA: Arises from the false lumen and is widely patent. Inflow: Right common iliac artery stenting has been performed. There is a high-grade stenosis (greater than 75%) just proximal to the stented segment. There is, additionally, a greater than 75% stenosis of the  distal common iliac artery at the terminal portion of the stent best seen on image # 256. The dissection flap extends into the left common iliac artery which demonstrates a high-grade stenosis at image # 237 and subsequently at the common iliac bifurcation at image # 251 Veins: No obvious venous abnormality within the limitations of this arterial phase study. Review of the MIP images confirms the above findings. NON-VASCULAR Hepatobiliary: No focal liver abnormality is seen. No gallstones, gallbladder wall thickening, or biliary dilatation. Pancreas: Unremarkable Spleen: Unremarkable Adrenals/Urinary Tract: Adrenal glands are unremarkable. Kidneys are normal, without renal calculi, focal lesion, or hydronephrosis. Bladder is unremarkable. Stomach/Bowel: Stomach is within normal limits. Appendix appears normal. No evidence of bowel wall thickening, distention, or inflammatory changes. Lymphatic: No pathologic adenopathy within the abdomen and pelvis. Reproductive: Prostate is unremarkable. Other: No abdominal wall hernia Musculoskeletal: No acute bone abnormality Review of the MIP images confirms the above findings. IMPRESSION: 1. Type A aortic dissection with wide communication between the true and false lumens. The dissection flap extends into the arch vasculature and subsequently into the right common carotid and right subclavian arteries. The dissection flap extends into the celiac axis and common hepatic artery. The dissection flap extends into the superior mesenteric artery and there is occlusion of the mid and distal superior mesenteric artery. The left renal artery and IMA arise from the false lumen and are widely patent. The ascending aorta is mildly dilated measuring 4.5 cm in maximal diameter. 2. Infiltration of the mediastinal fat which may relate to interstitial hemorrhage or edema related to aortic dissection. There is, however, no formed hematoma identified. 3. Bilateral common iliac artery stents with  high-grade stenosis just proximal to the stented segment and at the terminal portion of the stented segment. 4. High-grade stenosis of the left common iliac artery at the common iliac bifurcation. 5. Mild global cardiomegaly. Left anterior descending and circumflex coronary artery stenting. 6. These results were called by telephone at the time of interpretation on 01/08/2022 at 7:29 pm to  provider HAO MENG , who verbally acknowledged these results. Electronically Signed   By: Helyn Numbers M.D.   On: 01/07/2022 19:29   CARDIAC CATHETERIZATION  Result Date: 12/17/2021   Prox RCA lesion is 50% stenosed.   Previously placed Prox LAD to Mid LAD stent of unknown type is  widely patent.   Previously placed Prox Cx to Dist Cx stent of unknown type is  widely patent.   There is moderate to severe left ventricular systolic dysfunction.   LV end diastolic pressure is severely elevated.   The left ventricular ejection fraction is 25-35% by visual estimate.   There is no aortic valve stenosis. Multiple prior stents present in the LAD and circumflex, all of which are widely patent.  RCA with moderate disease.  He appears to have significant LV dysfunction including anterior wall hypokinesis.  Significantly elevated LVEDP.  Per the history, he has not been taking antiplatelet medications despite having a recent PCI at [facility]. IV nitroglycerin started.  Equal radial pulses on both sides.  No trouble navigating the ascending aorta during catheterization.  No obvious evidence of aortic dissection. Patient was loaded with Brilinta. Home medicines list Plavix.  Plan to start Plavix tomorrow. He needs aggressive heart failure management, diuresis and blood pressure control. I spoke to the patient's mother, Alvira Monday, [phone].    Cardiac Studies   [facility] 01/06/2022:   Prox RCA lesion is 50% stenosed.   Previously placed Prox LAD to Mid LAD stent of unknown type is  widely patent.   Previously placed Prox Cx to Dist Cx stent of  unknown type is  widely patent.   There is moderate to severe left ventricular systolic dysfunction.   LV end diastolic pressure is severely elevated.   The left ventricular ejection fraction is 25-35% by visual estimate.   There is no aortic valve stenosis.   Multiple prior stents present in the LAD and circumflex, all of which are widely patent.  RCA with moderate disease.  He appears to have significant LV dysfunction including anterior wall hypokinesis.  Significantly elevated LVEDP.  Per the history, he has not been taking antiplatelet medications despite having a recent PCI at [facility].   IV nitroglycerin started.  Equal radial pulses on both sides.  No trouble navigating the ascending aorta during catheterization.  No obvious evidence of aortic dissection. Patient was loaded with Brilinta. Home medicines list Plavix.  Plan to start Plavix tomorrow. He needs aggressive heart failure management, diuresis and blood pressure control.    Patient Profile     41 y.o. male   Assessment & Plan    #Type a aortic dissection: Patient presented with chest pain.  Initially taken to the Cath Lab for code STEMI and was found to have patent stents and 50% RCA disease.  He had a CTA of the chest abdomen and pelvis which revealed a type a aortic dissection with communication between the 2 and false lumens.  The dissection flap extended into the aortic arch vasculature including the right common carotid and right subclavian artery.  I explained distally to the celiac and common hepatic arteries as well as into the SMA.  There is occlusion of the mid and distal SMA.  He was taken emergently to the OR for repair with Dr. Donata Clay.  He was previously loaded with ticagrelor  #Hypertension:   # CAD s/p PCI Patient had a stent placed reportedly at [facility] one however no records are available in Care Everywhere.  He noted  that he stopped taking his medications and signed out AMA.  He called EMS with chest pain.   EKG showed ST elevation/early repolarization pattern in V1 through V6 and there were no priors to compare.  Code STEMI was called and he was taken emergently to the Cath Lab where he was found to have a 50% proximal RCA stenosis.  Previously placed proximal to mid LAD and proximal to distal left circumflex stents were patent.  LVEDP was severely elevated.  LVEF was 25 to 35% with anterior hypokinesis.  # Acute on chronic systolic diastolic heart failure: LVEF was 25 to 30% at the time of his cath yesterday.  He had a TEE that showed severe LVH with septal wall thickness greater than 2 cm.  He has uncontrolled hypertension.   {Are we signing off today?:[id number]}  For questions or updates, please contact [facility] Please consult [email or web address] for contact info under        Signed, Chilton Si, MD  01/07/2022, 11:32 AM

## 2021-12-29 NOTE — Progress Notes (Signed)
CT surgery PM rounds  Patient now with biventricular mechanical support with flow rates of 3.2 L/min Severe postoperative coagulopathic bleeding persists despite transfusion and clotting factors.  DIC panel shows PTT greater than 200, platelet count 80,000, fibrinogen 215k.  We will continue to aggressively treat coagulopathy and maintain adequate hemoglobin 9.0.  Patient's body temperature 34.5-warming blood products and applying Bair hugger.

## 2021-12-29 NOTE — Progress Notes (Signed)
Patient transported on vent to cath lab. No complications noted.

## 2021-12-29 NOTE — Progress Notes (Signed)
1 Day Post-Op Procedure(s) (LRB): REPAIR TYPE A DISSECTION (N/A) PLACEMENT OF IMPELLA LEFT VENTRICULAR ASSIST DEVICE (N/A) Subjective: Patient with unstable hemodynamics  in ICU while on Impella 5.5 due to RV dysfunction- discussed with Dr Gala Romney for coordination of care. Will prob need temp RV mechanical support- Impella RP being considered Post op coagulopathic bleeding remains a challenge as well- PTT 69, plts 86k. Factors being infused   Objective: Vital signs in last 24 hours: Temp:  [97 F (36.1 C)-97.3 F (36.3 C)] 97.2 F (36.2 C) (06/17 1145) Pulse Rate:  [67-175] 97 (06/17 1130) Resp:  [9-33] 15 (06/17 1145) BP: (104-223)/(73-139) 104/85 (06/17 1000) SpO2:  [84 %-100 %] 99 % (06/17 1130) Arterial Line BP: (69-138)/(66-89) 69/66 (06/17 1145) FiO2 (%):  [80 %-100 %] 80 % (06/17 0948) Weight:  [83.9 kg] 83.9 kg (06/16 1700)  Hemodynamic parameters for last 24 hours: PAP: (23-29)/(15-20) 23/17 CVP:  [5 mmHg-14 mmHg] 14 mmHg PCWP:  [17 mmHg] 17 mmHg CO:  [2.5 L/min] 2.5 L/min CI:  [1.2 L/min/m2] 1.2 L/min/m2  Intake/Output from previous day: 06/16 0701 - 06/17 0700 In: 6079.6 [I.V.:4982.6; Blood:1097] Out: 1135 [Urine:1135] Intake/Output this shift: Total I/O In: 2657 [Blood:2657] Out: 3036 [Blood:3036]    Lab Results: Recent Labs    01/07/2022 0255 01/05/2022 0322 01/06/2022 0736 01/05/2022 0945  WBC 6.2  --   --  8.7  HGB 9.3*   < > 7.1* 10.7*  HCT 27.1*   < > 21.0* 30.5*  PLT 82*  --   --  86*   < > = values in this interval not displayed.   BMET:  Recent Labs    12/22/2021 1955 12/15/2021 2137 01/02/2022 0656 12/26/2021 0736 12/22/2021 0945  NA 138   < > 143 144 142  K 2.9*   < > 4.4 4.5 4.5  CL 105   < > 106  --  112*  CO2 17*  --   --   --  21*  GLUCOSE 230*   < > 150*  --  152*  BUN 21*   < > 19  --  19  CREATININE 1.94*   < > 1.40*  --  1.83*  CALCIUM 9.1  --   --   --  7.4*   < > = values in this interval not displayed.    PT/INR:  Recent Labs     01/01/2022 0945  LABPROT 16.7*  INR 1.4*   ABG    Component Value Date/Time   PHART 7.306 (L) 01/04/2022 0736   HCO3 23.7 01/04/2022 0736   TCO2 25 12/25/2021 0736   ACIDBASEDEF 2.0 12/19/2021 0736   O2SAT 95 12/26/2021 0736   CBG (last 3)  Recent Labs    12/26/2021 0944  GLUCAP 153*    Assessment/Plan: S/P Procedure(s) (LRB): REPAIR TYPE A DISSECTION (N/A) PLACEMENT OF IMPELLA LEFT VENTRICULAR ASSIST DEVICE (N/A) Patient is critically ill with pre and postop ventricular dysfunction , coagulopathic bleeding. Situation d/w patients Mother this am after completion of surgery   LOS: 1 day    Lovett Sox 12/19/2021

## 2021-12-29 NOTE — Progress Notes (Signed)
Patient transported back to 2H03. No complications noted.

## 2021-12-29 NOTE — Brief Op Note (Signed)
01-10-2022  2:56 AM  PATIENT:  [name]  41 y.o. male  PRE-OPERATIVE DIAGNOSIS:  aortic dissection  POST-OPERATIVE DIAGNOSIS:  aortic dissection  PROCEDURE:  Procedure(s): REPAIR TYPE A DISSECTION (N/A)  SURGEON:  Surgeon(s) and Role:    Lovett Sox, MD - Primary  PHYSICIAN ASSISTANT: WAYNE GOLD PA-C  ASSISTANTS: STAFF RNFA   ANESTHESIA:   general  EBL: 600 cc   BLOOD ADMINISTERED: 4 units CC PRBC, 2 units FFP, and 2 units PLTS   Circulatory arrest  for distal graft placement  30 minutes   DRAINS:  MEDIASTINAL CHEST TUBES    LOCAL MEDICATIONS USED:  NONE  SPECIMEN:  Source of Specimen:  AORTA  DISPOSITION OF SPECIMEN:  PATHOLOGY  COUNTS:  YES  TOURNIQUET:  * No tourniquets in log *  DICTATION: .Other Dictation: Dictation Number PENDING  PLAN OF CARE: Admit to inpatient   PATIENT DISPOSITION:  ICU - intubated and critically ill.   Delay start of Pharmacological VTE agent (>24hrs) due to surgical blood loss or risk of bleeding: yes

## 2021-12-29 NOTE — Progress Notes (Signed)
  Echocardiogram Echocardiogram Transesophageal has been performed.  Delcie Roch 01/11/2022, 12:18 PM

## 2021-12-29 NOTE — Progress Notes (Signed)
  Patient came back from cath lab.   Continues to bleed. Several hundred cc of coffee grounds in his NGT.  Also about 500cc of blood in chest tube in last hour.   Impella flows limited by low volumes. We have given 4 more units of RBCs and 2uFFP + Factor 7.   We have d/w Dr. Donata Clay. Will check DIC panel and TEG.   Continue supportive care.   I updated his mother by phone.   Additional 1 hr cct.   Arvilla Meres, MD  4:16 PM

## 2021-12-29 NOTE — Consult Note (Addendum)
Advanced Heart Failure Team Consult Note   Primary Physician: Patient, No Pcp Per PCP-Cardiologist:  None  Reason for Consultation: Cardiogenic shock   HPI:    [name] is seen today for evaluation of cardiogenic shock at the request of Dr. Donata Clay.   41 y/o male smoker with severe HTN, CAD, PAD. Admitted yesterday with severe CP.  Cath LAD and LCx stents patent. 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   Spent all night in OR. EF found to be 25% with severe RV dysfunction. Impella 5.5 placed.  Returned from OR this morning in persistent shock despite epi, milrinone, NE and VP. Sternum not closed. Muscle and skin closed. Coagulopathic with moderate bleeding in CTs. Receiving cryo and factor 7   Impella only able to flow at P6   My initial swan #s  CVP 17  RA 17 PA 25/18 (21) PCWP 18  Thermo 2.5/1.2 SVR 2316 PVR 0.8 PAPI 0.4  Review of Systems: Unavailable (intubated)   Home Medications Prior to Admission medications   Medication Sig Start Date End Date Taking? Authorizing Provider  aspirin EC 81 MG tablet Take 1 tablet (81 mg total) by mouth daily. 07/18/15   Yates Decamp, MD  carvedilol (COREG) 12.5 MG tablet Take 1 tablet (12.5 mg total) by mouth 2 (two) times daily with a meal. 07/18/15   Yates Decamp, MD  clopidogrel (PLAVIX) 75 MG tablet Take 1 tablet (75 mg total) by mouth daily. 07/18/15   Yates Decamp, MD    Past Medical History: Past Medical History:  Diagnosis Date   CAD (coronary artery disease)    Heart attack (HCC)    Hypertension    PVD (peripheral vascular disease) (HCC)    Tobacco abuse     Past Surgical History: Past Surgical History:  Procedure Laterality Date   PERIPHERAL VASCULAR CATHETERIZATION N/A 07/18/2015   Procedure: Abdominal Aortogram;  Surgeon: Yates Decamp, MD;  Location: [facility] INVASIVE CV LAB;  Service: Cardiovascular;  Laterality: N/A;   PERIPHERAL VASCULAR CATHETERIZATION  07/18/2015   Procedure: Lower  Extremity Angiography;  Surgeon: Yates Decamp, MD;  Location: [facility] INVASIVE CV LAB;  Service: Cardiovascular;;   PERIPHERAL VASCULAR CATHETERIZATION  07/18/2015   Procedure: Peripheral Vascular Intervention;  Surgeon: Yates Decamp, MD;  Location: [facility] INVASIVE CV LAB;  Service: Cardiovascular;;  RIGHT COMMON ILIAC    Family History: Family History  Problem Relation Age of Onset   CAD Neg Hx     Social History: Social History   Socioeconomic History   Marital status: Single    Spouse name: Not on file   Number of children: Not on file   Years of education: Not on file   Highest education level: Not on file  Occupational History   Not on file  Tobacco Use   Smoking status: Every Day   Smokeless tobacco: Not on file  Substance and Sexual Activity   Alcohol use: No   Drug use: Yes    Types: Marijuana   Sexual activity: Not on file  Other Topics Concern   Not on file  Social History Narrative   Not on file   Social Determinants of Health   Financial Resource Strain: Not on file  Food Insecurity: Not on file  Transportation Needs: Not on file  Physical Activity: Not on file  Stress: Not on file  Social Connections: Not on file    Allergies:  No Known Allergies  Objective:  Vital Signs:   Temp:  [97 F (36.1 C)-97.3 F (36.3 C)] 97.2 F (36.2 C) (06/17 1200) Pulse Rate:  [67-175] 91 (06/17 1200) Resp:  [9-33] 15 (06/17 1200) BP: (104-223)/(73-139) 124/78 (06/17 1200) SpO2:  [84 %-100 %] 98 % (06/17 1200) Arterial Line BP: (69-138)/(66-89) 94/76 (06/17 1200) FiO2 (%):  [80 %-100 %] 80 % (06/17 1112) Weight:  [83.9 kg] 83.9 kg (06/16 1700)    Weight change: Filed Weights   12/22/2021 1700  Weight: 83.9 kg    Intake/Output:   Intake/Output Summary (Last 24 hours) at 12/20/2021 1231 Last data filed at 01/05/2022 1200 Gross per 24 hour  Intake 8736.56 ml  Output 5481 ml  Net 3255.56 ml      Physical Exam    General:  Intubated/sedated HEENT: normal + ETT +  scleral edema  Neck: supple. JVP up RIJ swan . Carotids 2+ bilat; no bruits. No lymphadenopathy or thyromegaly appreciated. [id number] dressing  Direct aortic Impella . Regular rate & rhythm.  + CTs with moderate drainage Lungs: clear Abdomen: soft, nontender, nondistended. Hypoactive BS Extremities: no cyanosis, clubbing, rash, 2+ edema Neuro: intubated sedated   Telemetry   AV paced 80   Labs   Basic Metabolic Panel: Recent Labs  Lab 12/16/2021 1645 01/06/2022 1716 12/15/2021 1955 12/19/2021 2137 12/13/2021 0322 01/03/2022 0437 01/02/2022 0502 12/19/2021 0533 12/29/2021 [id number] 01/08/2022 0656 12/22/2021 0736 12/13/2021 0945  NA 141   < > 138   < > 142 140   < > 143 144 143 144 142  K 3.7   < > 2.9*   < > 4.4 4.5   < > 4.0 4.4 4.4 4.5 4.5  CL 109   < > 105   < > 104 103  --  102  --  106  --  112*  CO2 22  --  17*  --   --   --   --   --   --   --   --  21*  GLUCOSE 119*   < > 230*   < > 152* 170*  --  185*  --  150*  --  152*  BUN 23*   < > 21*   < > 21* 19  --  20  --  19  --  19  CREATININE 1.89*   < > 1.94*   < > 1.30* 1.30*  --  1.40*  --  1.40*  --  1.83*  CALCIUM 9.1  --  9.1  --   --   --   --   --   --   --   --  7.4*  MG  --   --   --   --   --   --   --   --   --   --   --  2.2   < > = values in this interval not displayed.    Liver Function Tests: Recent Labs  Lab 01/06/2022 1645  AST 17  ALT 14  ALKPHOS 59  BILITOT 1.1  PROT 6.5  ALBUMIN 4.1   No results for input(s): "LIPASE", "AMYLASE" in the last 168 hours. No results for input(s): "AMMONIA" in the last 168 hours.  CBC: Recent Labs  Lab 12/26/2021 1645 01/04/2022 1716 12/16/2021 0255 01/11/2022 0322 12/23/2021 0533 12/26/2021 [id number] 12/18/2021 0656 01/07/2022 0736 12/22/2021 0945  WBC 12.9*  --  6.2  --   --   --   --   --  8.7  NEUTROABS 7.2  --   --   --   --   --   --   --   --   HGB 11.9*   < > 9.3*   < > 8.8* 8.8* 9.2* 7.1* 10.7*  HCT 36.1*   < > 27.1*   < > 26.0* 26.0* 27.0* 21.0* 30.5*  MCV 90.3  --  88.9  --   --    --   --   --  85.9  PLT 231  --  82*  --   --   --   --   --  86*   < > = values in this interval not displayed.    Cardiac Enzymes: No results for input(s): "CKTOTAL", "CKMB", "CKMBINDEX", "TROPONINI" in the last 168 hours.  BNP: BNP (last 3 results) No results for input(s): "BNP" in the last 8760 hours.  ProBNP (last 3 results) No results for input(s): "PROBNP" in the last 8760 hours.   CBG: Recent Labs  Lab 01/01/2022 0944  GLUCAP 153*    Coagulation Studies: Recent Labs    01/01/2022 1645 12/24/2021 0945  LABPROT 14.1 16.7*  INR 1.1 1.4*     Imaging   DG Chest Port 1 View  Result Date: 01/10/2022 CLINICAL DATA:  Aortic dissection EXAM: PORTABLE CHEST 1 VIEW COMPARISON:  Chest x-ray dated March 25, 2011 FINDINGS: ET tube tip is at the thoracic inlet, approximally 7.5 cm from the carina. Right IJ approach PA catheter projects over the expected area of the main pulmonary artery. Impella device overlies the expected area of the right ventricle. Gastric decompression tube tip and side port project over the expected area of the stomach. Mediastinal drains in place. Cardiac and mediastinal contours within normal limits. Small layering right pleural effusion. No evidence of pneumothorax. IMPRESSION: 1. ET tube tip is at the thoracic inlet, approximally 7.5 cm from the carina. 2. Small layering right pleural effusion. Electronically Signed   By: Allegra Lai M.D.   On: 01/01/2022 10:12   CT Angio Chest/Abd/Pel for Dissection W and/or W/WO  Result Date: 01/05/2022 CLINICAL DATA:  Chest pain or back pain, aortic dissection suspected. Diaphoresis, chest pain EXAM: CT ANGIOGRAPHY CHEST, ABDOMEN AND PELVIS TECHNIQUE: Multidetector CT imaging through the chest, abdomen and pelvis was performed using the standard protocol during bolus administration of intravenous contrast. Multiplanar reconstructed images and MIPs were obtained and reviewed to evaluate the vascular anatomy. RADIATION  DOSE REDUCTION: This exam was performed according to the departmental dose-optimization program which includes automated exposure control, adjustment of the mA and/or kV according to patient size and/or use of iterative reconstruction technique. CONTRAST:  OMNIPAQUE IOHEXOL 350 MG/ML SOLN COMPARISON:  None Available. FINDINGS: CTA CHEST FINDINGS Cardiovascular: There is a type a dissection present with the dissection flap demonstrating wide communication between the true and false lumens, best seen on image # 79/8 and with the dissection flap extending inferiorly to involve the lateral aspect of the right coronary artery cusp and non coronary artery cusp. There is no pericardial effusion. The dissection flap extends to involve the arch vasculature and extends into the innominate artery and subsequently into the right common carotid and right subclavian arteries. The left common carotid artery and subclavian artery arise from the true lumen. Within the descending aorta, the true lumen is compressed and is nearly obliterated, best appreciated at image # 71 and # 137. The ascending aorta is mildly dilated measuring 4.5 cm in maximal diameter. The descending  thoracic aorta is of normal caliber. Left anterior descending and circumflex coronary artery stenting has been performed. Global cardiac size is mildly enlarged. Central pulmonary arteries are poorly opacified. The central pulmonary arteries are of normal caliber. Mediastinum/Nodes: There is infiltration of the mediastinal fat which may relate to interstitial hemorrhage or edema related to aortic dissection. There is, however, no mass formed hematoma identified, however. Visualized thyroid is unremarkable. Esophagus is unremarkable. No pathologic thoracic adenopathy. Lungs/Pleura: Lungs are clear. No pleural effusion or pneumothorax. Musculoskeletal: No acute bone abnormality Review of the MIP images confirms the above findings. CTA ABDOMEN AND PELVIS FINDINGS  VASCULAR Aorta: The dissection flap extends through the abdominal aorta and the true lumen is nearly obliterated at the diaphragmatic hiatus, as mentioned above and is subsequently occluded just proximal to the aortic bifurcation. No aneurysm. No periaortic inflammatory change or hemorrhage. Celiac: The dissection flap extends into the celiac axis and common hepatic artery. No aneurysm. Widely patent distally. SMA: The dissection flap extends into the superior mesenteric artery and there is occlusion of the mid and distal superior mesenteric artery seen on axial image # 194-196. Only the first jejunal branch is opacified. Renals: Right renal artery arises from the true lumen. Left renal artery arises from the false lumen. Both vessels are widely patent and there is symmetric enhancement of the kidneys. IMA: Arises from the false lumen and is widely patent. Inflow: Right common iliac artery stenting has been performed. There is a high-grade stenosis (greater than 75%) just proximal to the stented segment. There is, additionally, a greater than 75% stenosis of the distal common iliac artery at the terminal portion of the stent best seen on image # 256. The dissection flap extends into the left common iliac artery which demonstrates a high-grade stenosis at image # 237 and subsequently at the common iliac bifurcation at image # 251 Veins: No obvious venous abnormality within the limitations of this arterial phase study. Review of the MIP images confirms the above findings. NON-VASCULAR Hepatobiliary: No focal liver abnormality is seen. No gallstones, gallbladder wall thickening, or biliary dilatation. Pancreas: Unremarkable Spleen: Unremarkable Adrenals/Urinary Tract: Adrenal glands are unremarkable. Kidneys are normal, without renal calculi, focal lesion, or hydronephrosis. Bladder is unremarkable. Stomach/Bowel: Stomach is within normal limits. Appendix appears normal. No evidence of bowel wall thickening,  distention, or inflammatory changes. Lymphatic: No pathologic adenopathy within the abdomen and pelvis. Reproductive: Prostate is unremarkable. Other: No abdominal wall hernia Musculoskeletal: No acute bone abnormality Review of the MIP images confirms the above findings. IMPRESSION: 1. Type A aortic dissection with wide communication between the true and false lumens. The dissection flap extends into the arch vasculature and subsequently into the right common carotid and right subclavian arteries. The dissection flap extends into the celiac axis and common hepatic artery. The dissection flap extends into the superior mesenteric artery and there is occlusion of the mid and distal superior mesenteric artery. The left renal artery and IMA arise from the false lumen and are widely patent. The ascending aorta is mildly dilated measuring 4.5 cm in maximal diameter. 2. Infiltration of the mediastinal fat which may relate to interstitial hemorrhage or edema related to aortic dissection. There is, however, no formed hematoma identified. 3. Bilateral common iliac artery stents with high-grade stenosis just proximal to the stented segment and at the terminal portion of the stented segment. 4. High-grade stenosis of the left common iliac artery at the common iliac bifurcation. 5. Mild global cardiomegaly. Left anterior descending and circumflex   coronary artery stenting. 6. These results were called by telephone at the time of interpretation on 01/06/2022 at 7:29 pm to provider [facility] , who verbally acknowledged these results. Electronically Signed   By: Fidela Salisbury M.D.   On: 12/20/2021 19:29   CARDIAC CATHETERIZATION  Result Date: 12/16/2021   Prox RCA lesion is 50% stenosed.   Previously placed Prox LAD to Mid LAD stent of unknown type is  widely patent.   Previously placed Prox Cx to Dist Cx stent of unknown type is  widely patent.   There is moderate to severe left ventricular systolic dysfunction.   LV end  diastolic pressure is severely elevated.   The left ventricular ejection fraction is 25-35% by visual estimate.   There is no aortic valve stenosis. Multiple prior stents present in the LAD and circumflex, all of which are widely patent.  RCA with moderate disease.  He appears to have significant LV dysfunction including anterior wall hypokinesis.  Significantly elevated LVEDP.  Per the history, he has not been taking antiplatelet medications despite having a recent PCI at [facility]. IV nitroglycerin started.  Equal radial pulses on both sides.  No trouble navigating the ascending aorta during catheterization.  No obvious evidence of aortic dissection. Patient was loaded with Brilinta. Home medicines list Plavix.  Plan to start Plavix tomorrow. He needs aggressive heart failure management, diuresis and blood pressure control. I spoke to the patient's mother, Deforest Hoyles, [phone].     Medications:     Current Medications:  sodium chloride   Intravenous Once   sodium chloride   Intravenous Once   sodium chloride   Intravenous Once   [START ON 01/10/2022] acetaminophen  1,000 mg Oral Q6H   Or   [START ON 12/14/2021] acetaminophen (TYLENOL) oral liquid 160 mg/5 mL  1,000 mg Per Tube Q6H   acetaminophen (TYLENOL) oral liquid 160 mg/5 mL  650 mg Per Tube Once   Or   acetaminophen  650 mg Rectal Once   [START ON 12/15/2021] aspirin EC  325 mg Oral Daily   Or   [START ON 01/07/2022] aspirin  324 mg Per Tube Daily   [START ON 01/09/2022] bisacodyl  10 mg Oral Daily   Or   [START ON 12/23/2021] bisacodyl  10 mg Rectal Daily   chlorhexidine  15 mL Mouth/Throat NOW   Chlorhexidine Gluconate Cloth  6 each Topical Daily   docusate  100 mg Per Tube BID   [START ON 01/06/2022] docusate sodium  200 mg Oral Daily   fentaNYL (SUBLIMAZE) injection  50 mcg Intravenous Once   metoprolol tartrate  12.5 mg Oral BID   Or   metoprolol tartrate  12.5 mg Per Tube BID   [START ON 01-07-2022] pantoprazole  40 mg Oral Daily    polyethylene glycol  17 g Per Tube Daily   [START ON 12/15/2021] sodium chloride flush  3 mL Intravenous Q12H    Infusions:  sodium chloride 10 mL/hr at 12/14/2021 1225   [START ON 12/21/2021] sodium chloride     sodium chloride     sodium chloride     albumin human     amiodarone 60 mg/hr (01/02/2022 1141)    ceFAZolin (ANCEF) IV 2 g (01/03/2022 1226)   dexmedetomidine (PRECEDEX) IV infusion 0.7 mcg/kg/hr (12/23/2021 0951)   dextrose 5 % (Impella PURGE) solution 1000 mL bag     epinephrine     famotidine (PEPCID) IV     fentaNYL infusion INTRAVENOUS 50 mcg/hr (01/11/2022 1042)  heparin 50 units/mL (Impella PURGE) in dextrose 5% 1000 mL     insulin 2.2 Units/hr (01/06/2022 0930)   lactated ringers     lactated ringers     lactated ringers 20 mL/hr at 12/14/2021 0930   magnesium sulfate     midazolam 1 mg/hr (01/02/2022 1201)   nitroGLYCERIN     norepinephrine (LEVOPHED) Adult infusion     phenylephrine (NEO-SYNEPHRINE) Adult infusion     vancomycin     vasopressin 0.03 Units/min (12/13/2021 0930)       Assessment/Plan   1. Cardiogenic shock with severe biventricular fiaiure - Baseline EF unknown. EF 25-35% during cath on admit  - Echo done at bedside EF 10% severe LVH RV severely down  - CO remains critically low despite Impella 5.5 support and multiple gtts - I have tirated drips multiple times at bedside and also repositioned Impella for optimal placement. Impella 4.3 cm from Aov. Flow 3.6 on P-6 - Swan #s c/w RV failure - Needs RV support. D/w options with Dr. PVT. Felt to be high risk for return to OR for central cannulation. Will plan RP Impella if able to feed the device. I d/w his mother who agrees  2. Acute Type A aortic dissection  - s/p repair 6/16 - plan as above  3. Acute blood loss anemia with severe coagulopathy - replace blood and factors. - keep hgb > 7.5  4. Acute hypoxic respiratory failure, post-op - vent per TCTS  5. Severe HTN - BP now low with shock  6.  CAD and PAD - 01/05/2022 Cath LAD and LCx stents patent. RCA 50% EF 25-35% - ASA/statin when able  7. AKI  - due to ATN - suspect he will need CVVHD at some point   Prognosis extremely guarded. Low probability of survival. I discussed this with his mother. She understands. Continue aggressive care for now.   CRITICAL CARE Performed by: Glori Bickers  Total critical care time: 180 minutes  Critical care time was exclusive of separately billable procedures and treating other patients.  Critical care was necessary to treat or prevent imminent or life-threatening deterioration.  Critical care was time spent personally by me (independent of midlevel providers or residents) on the following activities: development of treatment plan with patient and/or surrogate as well as nursing, discussions with consultants, evaluation of patient's response to treatment, examination of patient, obtaining history from patient or surrogate, ordering and performing treatments and interventions, ordering and review of laboratory studies, ordering and review of radiographic studies, pulse oximetry and re-evaluation of patient's condition.     Length of Stay: 1  Glori Bickers, MD  12/13/2021, 12:31 PM  Advanced Heart Failure Team Pager [phone] (M-F; 7a - 5p)  Please contact [facility] for night-coverage after hours (4p -7a ) and weekends on [email or web address]

## 2021-12-29 NOTE — H&P (View-Only) (Signed)
Advanced Heart Failure Team Consult Note   Primary Physician: Patient, No Pcp Per PCP-Cardiologist:  None  Reason for Consultation: Cardiogenic shock   HPI:    [name] is seen today for evaluation of cardiogenic shock at the request of Dr. Donata Clay.   41 y/o male smoker with severe HTN, CAD, PAD. Admitted yesterday with severe CP.  Cath LAD and LCx stents patent. 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   Spent all night in OR. EF found to be 25% with severe RV dysfunction. Impella 5.5 placed.  Returned from OR this morning in persistent shock despite epi, milrinone, NE and VP. Sternum not closed. Muscle and skin closed. Coagulopathic with moderate bleeding in CTs. Receiving cryo and factor 7   Impella only able to flow at P6   My initial swan #s  CVP 17  RA 17 PA 25/18 (21) PCWP 18  Thermo 2.5/1.2 SVR 2316 PVR 0.8 PAPI 0.4  Review of Systems: Unavailable (intubated)   Home Medications Prior to Admission medications   Medication Sig Start Date End Date Taking? Authorizing Provider  aspirin EC 81 MG tablet Take 1 tablet (81 mg total) by mouth daily. 07/18/15   Yates Decamp, MD  carvedilol (COREG) 12.5 MG tablet Take 1 tablet (12.5 mg total) by mouth 2 (two) times daily with a meal. 07/18/15   Yates Decamp, MD  clopidogrel (PLAVIX) 75 MG tablet Take 1 tablet (75 mg total) by mouth daily. 07/18/15   Yates Decamp, MD    Past Medical History: Past Medical History:  Diagnosis Date   CAD (coronary artery disease)    Heart attack (HCC)    Hypertension    PVD (peripheral vascular disease) (HCC)    Tobacco abuse     Past Surgical History: Past Surgical History:  Procedure Laterality Date   PERIPHERAL VASCULAR CATHETERIZATION N/A 07/18/2015   Procedure: Abdominal Aortogram;  Surgeon: Yates Decamp, MD;  Location: [facility] INVASIVE CV LAB;  Service: Cardiovascular;  Laterality: N/A;   PERIPHERAL VASCULAR CATHETERIZATION  07/18/2015   Procedure: Lower  Extremity Angiography;  Surgeon: Yates Decamp, MD;  Location: [facility] INVASIVE CV LAB;  Service: Cardiovascular;;   PERIPHERAL VASCULAR CATHETERIZATION  07/18/2015   Procedure: Peripheral Vascular Intervention;  Surgeon: Yates Decamp, MD;  Location: [facility] INVASIVE CV LAB;  Service: Cardiovascular;;  RIGHT COMMON ILIAC    Family History: Family History  Problem Relation Age of Onset   CAD Neg Hx     Social History: Social History   Socioeconomic History   Marital status: Single    Spouse name: Not on file   Number of children: Not on file   Years of education: Not on file   Highest education level: Not on file  Occupational History   Not on file  Tobacco Use   Smoking status: Every Day   Smokeless tobacco: Not on file  Substance and Sexual Activity   Alcohol use: No   Drug use: Yes    Types: Marijuana   Sexual activity: Not on file  Other Topics Concern   Not on file  Social History Narrative   Not on file   Social Determinants of Health   Financial Resource Strain: Not on file  Food Insecurity: Not on file  Transportation Needs: Not on file  Physical Activity: Not on file  Stress: Not on file  Social Connections: Not on file    Allergies:  No Known Allergies  Objective:  Vital Signs:   Temp:  [97 F (36.1 C)-97.3 F (36.3 C)] 97.2 F (36.2 C) (06/17 1200) Pulse Rate:  [67-175] 91 (06/17 1200) Resp:  [9-33] 15 (06/17 1200) BP: (104-223)/(73-139) 124/78 (06/17 1200) SpO2:  [84 %-100 %] 98 % (06/17 1200) Arterial Line BP: (69-138)/(66-89) 94/76 (06/17 1200) FiO2 (%):  [80 %-100 %] 80 % (06/17 1112) Weight:  [83.9 kg] 83.9 kg (06/16 1700)    Weight change: Filed Weights   12/22/2021 1700  Weight: 83.9 kg    Intake/Output:   Intake/Output Summary (Last 24 hours) at 12/20/2021 1231 Last data filed at 01/05/2022 1200 Gross per 24 hour  Intake 8736.56 ml  Output 5481 ml  Net 3255.56 ml      Physical Exam    General:  Intubated/sedated HEENT: normal + ETT +  scleral edema  Neck: supple. JVP up RIJ swan . Carotids 2+ bilat; no bruits. No lymphadenopathy or thyromegaly appreciated. [id number] dressing  Direct aortic Impella . Regular rate & rhythm.  + CTs with moderate drainage Lungs: clear Abdomen: soft, nontender, nondistended. Hypoactive BS Extremities: no cyanosis, clubbing, rash, 2+ edema Neuro: intubated sedated   Telemetry   AV paced 80   Labs   Basic Metabolic Panel: Recent Labs  Lab 12/16/2021 1645 01/06/2022 1716 12/15/2021 1955 12/19/2021 2137 12/13/2021 0322 01/03/2022 0437 01/02/2022 0502 12/19/2021 0533 12/26/2021 [id number] 01/08/2022 0656 12/22/2021 0736 12/21/2021 0945  NA 141   < > 138   < > 142 140   < > 143 144 143 144 142  K 3.7   < > 2.9*   < > 4.4 4.5   < > 4.0 4.4 4.4 4.5 4.5  CL 109   < > 105   < > 104 103  --  102  --  106  --  112*  CO2 22  --  17*  --   --   --   --   --   --   --   --  21*  GLUCOSE 119*   < > 230*   < > 152* 170*  --  185*  --  150*  --  152*  BUN 23*   < > 21*   < > 21* 19  --  20  --  19  --  19  CREATININE 1.89*   < > 1.94*   < > 1.30* 1.30*  --  1.40*  --  1.40*  --  1.83*  CALCIUM 9.1  --  9.1  --   --   --   --   --   --   --   --  7.4*  MG  --   --   --   --   --   --   --   --   --   --   --  2.2   < > = values in this interval not displayed.    Liver Function Tests: Recent Labs  Lab 01/06/2022 1645  AST 17  ALT 14  ALKPHOS 59  BILITOT 1.1  PROT 6.5  ALBUMIN 4.1   No results for input(s): "LIPASE", "AMYLASE" in the last 168 hours. No results for input(s): "AMMONIA" in the last 168 hours.  CBC: Recent Labs  Lab 12/26/2021 1645 01/04/2022 1716 12/16/2021 0255 01/11/2022 0322 12/18/2021 0533 12/26/2021 [id number] 12/18/2021 0656 01/07/2022 0736 12/14/2021 0945  WBC 12.9*  --  6.2  --   --   --   --   --  8.7  NEUTROABS 7.2  --   --   --   --   --   --   --   --   HGB 11.9*   < > 9.3*   < > 8.8* 8.8* 9.2* 7.1* 10.7*  HCT 36.1*   < > 27.1*   < > 26.0* 26.0* 27.0* 21.0* 30.5*  MCV 90.3  --  88.9  --   --    --   --   --  85.9  PLT 231  --  82*  --   --   --   --   --  86*   < > = values in this interval not displayed.    Cardiac Enzymes: No results for input(s): "CKTOTAL", "CKMB", "CKMBINDEX", "TROPONINI" in the last 168 hours.  BNP: BNP (last 3 results) No results for input(s): "BNP" in the last 8760 hours.  ProBNP (last 3 results) No results for input(s): "PROBNP" in the last 8760 hours.   CBG: Recent Labs  Lab 01/01/2022 0944  GLUCAP 153*    Coagulation Studies: Recent Labs    01/01/2022 1645 12/24/2021 0945  LABPROT 14.1 16.7*  INR 1.1 1.4*     Imaging   DG Chest Port 1 View  Result Date: 01/10/2022 CLINICAL DATA:  Aortic dissection EXAM: PORTABLE CHEST 1 VIEW COMPARISON:  Chest x-ray dated March 25, 2011 FINDINGS: ET tube tip is at the thoracic inlet, approximally 7.5 cm from the carina. Right IJ approach PA catheter projects over the expected area of the main pulmonary artery. Impella device overlies the expected area of the right ventricle. Gastric decompression tube tip and side port project over the expected area of the stomach. Mediastinal drains in place. Cardiac and mediastinal contours within normal limits. Small layering right pleural effusion. No evidence of pneumothorax. IMPRESSION: 1. ET tube tip is at the thoracic inlet, approximally 7.5 cm from the carina. 2. Small layering right pleural effusion. Electronically Signed   By: Allegra Lai M.D.   On: 01/01/2022 10:12   CT Angio Chest/Abd/Pel for Dissection W and/or W/WO  Result Date: 01/05/2022 CLINICAL DATA:  Chest pain or back pain, aortic dissection suspected. Diaphoresis, chest pain EXAM: CT ANGIOGRAPHY CHEST, ABDOMEN AND PELVIS TECHNIQUE: Multidetector CT imaging through the chest, abdomen and pelvis was performed using the standard protocol during bolus administration of intravenous contrast. Multiplanar reconstructed images and MIPs were obtained and reviewed to evaluate the vascular anatomy. RADIATION  DOSE REDUCTION: This exam was performed according to the departmental dose-optimization program which includes automated exposure control, adjustment of the mA and/or kV according to patient size and/or use of iterative reconstruction technique. CONTRAST:  OMNIPAQUE IOHEXOL 350 MG/ML SOLN COMPARISON:  None Available. FINDINGS: CTA CHEST FINDINGS Cardiovascular: There is a type a dissection present with the dissection flap demonstrating wide communication between the true and false lumens, best seen on image # 79/8 and with the dissection flap extending inferiorly to involve the lateral aspect of the right coronary artery cusp and non coronary artery cusp. There is no pericardial effusion. The dissection flap extends to involve the arch vasculature and extends into the innominate artery and subsequently into the right common carotid and right subclavian arteries. The left common carotid artery and subclavian artery arise from the true lumen. Within the descending aorta, the true lumen is compressed and is nearly obliterated, best appreciated at image # 71 and # 137. The ascending aorta is mildly dilated measuring 4.5 cm in maximal diameter. The descending  thoracic aorta is of normal caliber. Left anterior descending and circumflex coronary artery stenting has been performed. Global cardiac size is mildly enlarged. Central pulmonary arteries are poorly opacified. The central pulmonary arteries are of normal caliber. Mediastinum/Nodes: There is infiltration of the mediastinal fat which may relate to interstitial hemorrhage or edema related to aortic dissection. There is, however, no mass formed hematoma identified, however. Visualized thyroid is unremarkable. Esophagus is unremarkable. No pathologic thoracic adenopathy. Lungs/Pleura: Lungs are clear. No pleural effusion or pneumothorax. Musculoskeletal: No acute bone abnormality Review of the MIP images confirms the above findings. CTA ABDOMEN AND PELVIS FINDINGS  VASCULAR Aorta: The dissection flap extends through the abdominal aorta and the true lumen is nearly obliterated at the diaphragmatic hiatus, as mentioned above and is subsequently occluded just proximal to the aortic bifurcation. No aneurysm. No periaortic inflammatory change or hemorrhage. Celiac: The dissection flap extends into the celiac axis and common hepatic artery. No aneurysm. Widely patent distally. SMA: The dissection flap extends into the superior mesenteric artery and there is occlusion of the mid and distal superior mesenteric artery seen on axial image # [id number]. Only the first jejunal branch is opacified. Renals: Right renal artery arises from the true lumen. Left renal artery arises from the false lumen. Both vessels are widely patent and there is symmetric enhancement of the kidneys. IMA: Arises from the false lumen and is widely patent. Inflow: Right common iliac artery stenting has been performed. There is a high-grade stenosis (greater than 75%) just proximal to the stented segment. There is, additionally, a greater than 75% stenosis of the distal common iliac artery at the terminal portion of the stent best seen on image # 256. The dissection flap extends into the left common iliac artery which demonstrates a high-grade stenosis at image # 237 and subsequently at the common iliac bifurcation at image # 251 Veins: No obvious venous abnormality within the limitations of this arterial phase study. Review of the MIP images confirms the above findings. NON-VASCULAR Hepatobiliary: No focal liver abnormality is seen. No gallstones, gallbladder wall thickening, or biliary dilatation. Pancreas: Unremarkable Spleen: Unremarkable Adrenals/Urinary Tract: Adrenal glands are unremarkable. Kidneys are normal, without renal calculi, focal lesion, or hydronephrosis. Bladder is unremarkable. Stomach/Bowel: Stomach is within normal limits. Appendix appears normal. No evidence of bowel wall thickening,  distention, or inflammatory changes. Lymphatic: No pathologic adenopathy within the abdomen and pelvis. Reproductive: Prostate is unremarkable. Other: No abdominal wall hernia Musculoskeletal: No acute bone abnormality Review of the MIP images confirms the above findings. IMPRESSION: 1. Type A aortic dissection with wide communication between the true and false lumens. The dissection flap extends into the arch vasculature and subsequently into the right common carotid and right subclavian arteries. The dissection flap extends into the celiac axis and common hepatic artery. The dissection flap extends into the superior mesenteric artery and there is occlusion of the mid and distal superior mesenteric artery. The left renal artery and IMA arise from the false lumen and are widely patent. The ascending aorta is mildly dilated measuring 4.5 cm in maximal diameter. 2. Infiltration of the mediastinal fat which may relate to interstitial hemorrhage or edema related to aortic dissection. There is, however, no formed hematoma identified. 3. Bilateral common iliac artery stents with high-grade stenosis just proximal to the stented segment and at the terminal portion of the stented segment. 4. High-grade stenosis of the left common iliac artery at the common iliac bifurcation. 5. Mild global cardiomegaly. Left anterior descending and circumflex  coronary artery stenting. 6. These results were called by telephone at the time of interpretation on 01/06/2022 at 7:29 pm to provider [facility] , who verbally acknowledged these results. Electronically Signed   By: Fidela Salisbury M.D.   On: 12/20/2021 19:29   CARDIAC CATHETERIZATION  Result Date: 12/16/2021   Prox RCA lesion is 50% stenosed.   Previously placed Prox LAD to Mid LAD stent of unknown type is  widely patent.   Previously placed Prox Cx to Dist Cx stent of unknown type is  widely patent.   There is moderate to severe left ventricular systolic dysfunction.   LV end  diastolic pressure is severely elevated.   The left ventricular ejection fraction is 25-35% by visual estimate.   There is no aortic valve stenosis. Multiple prior stents present in the LAD and circumflex, all of which are widely patent.  RCA with moderate disease.  He appears to have significant LV dysfunction including anterior wall hypokinesis.  Significantly elevated LVEDP.  Per the history, he has not been taking antiplatelet medications despite having a recent PCI at [facility]. IV nitroglycerin started.  Equal radial pulses on both sides.  No trouble navigating the ascending aorta during catheterization.  No obvious evidence of aortic dissection. Patient was loaded with Brilinta. Home medicines list Plavix.  Plan to start Plavix tomorrow. He needs aggressive heart failure management, diuresis and blood pressure control. I spoke to the patient's mother, Deforest Hoyles, [phone].     Medications:     Current Medications:  sodium chloride   Intravenous Once   sodium chloride   Intravenous Once   sodium chloride   Intravenous Once   [START ON 01/01/2022] acetaminophen  1,000 mg Oral Q6H   Or   [START ON 12/14/2021] acetaminophen (TYLENOL) oral liquid 160 mg/5 mL  1,000 mg Per Tube Q6H   acetaminophen (TYLENOL) oral liquid 160 mg/5 mL  650 mg Per Tube Once   Or   acetaminophen  650 mg Rectal Once   [START ON 12/15/2021] aspirin EC  325 mg Oral Daily   Or   [START ON 01/07/2022] aspirin  324 mg Per Tube Daily   [START ON 01/04/2022] bisacodyl  10 mg Oral Daily   Or   [START ON 12/23/2021] bisacodyl  10 mg Rectal Daily   chlorhexidine  15 mL Mouth/Throat NOW   Chlorhexidine Gluconate Cloth  6 each Topical Daily   docusate  100 mg Per Tube BID   [START ON 01/06/2022] docusate sodium  200 mg Oral Daily   fentaNYL (SUBLIMAZE) injection  50 mcg Intravenous Once   metoprolol tartrate  12.5 mg Oral BID   Or   metoprolol tartrate  12.5 mg Per Tube BID   [START ON 01-07-2022] pantoprazole  40 mg Oral Daily    polyethylene glycol  17 g Per Tube Daily   [START ON 12/15/2021] sodium chloride flush  3 mL Intravenous Q12H    Infusions:  sodium chloride 10 mL/hr at 12/14/2021 1225   [START ON 12/21/2021] sodium chloride     sodium chloride     sodium chloride     albumin human     amiodarone 60 mg/hr (01/02/2022 1141)    ceFAZolin (ANCEF) IV 2 g (01/03/2022 1226)   dexmedetomidine (PRECEDEX) IV infusion 0.7 mcg/kg/hr (12/23/2021 0951)   dextrose 5 % (Impella PURGE) solution 1000 mL bag     epinephrine     famotidine (PEPCID) IV     fentaNYL infusion INTRAVENOUS 50 mcg/hr (01/11/2022 1042)  heparin 50 units/mL (Impella PURGE) in dextrose 5% 1000 mL     insulin 2.2 Units/hr (01/06/2022 0930)   lactated ringers     lactated ringers     lactated ringers 20 mL/hr at 01/06/2022 0930   magnesium sulfate     midazolam 1 mg/hr (01/02/2022 1201)   nitroGLYCERIN     norepinephrine (LEVOPHED) Adult infusion     phenylephrine (NEO-SYNEPHRINE) Adult infusion     vancomycin     vasopressin 0.03 Units/min (12/13/2021 0930)       Assessment/Plan   1. Cardiogenic shock with severe biventricular fiaiure - Baseline EF unknown. EF 25-35% during cath on admit  - Echo done at bedside EF 10% severe LVH RV severely down  - CO remains critically low despite Impella 5.5 support and multiple gtts - I have tirated drips multiple times at bedside and also repositioned Impella for optimal placement. Impella 4.3 cm from Aov. Flow 3.6 on P-6 - Swan #s c/w RV failure - Needs RV support. D/w options with Dr. PVT. Felt to be high risk for return to OR for central cannulation. Will plan RP Impella if able to feed the device. I d/w his mother who agrees  2. Acute Type A aortic dissection  - s/p repair 6/16 - plan as above  3. Acute blood loss anemia with severe coagulopathy - replace blood and factors. - keep hgb > 7.5  4. Acute hypoxic respiratory failure, post-op - vent per TCTS  5. Severe HTN - BP now low with shock  6.  CAD and PAD - 01/05/2022 Cath LAD and LCx stents patent. RCA 50% EF 25-35% - ASA/statin when able  7. AKI  - due to ATN - suspect he will need CVVHD at some point   Prognosis extremely guarded. Low probability of survival. I discussed this with his mother. She understands. Continue aggressive care for now.   CRITICAL CARE Performed by: Glori Bickers  Total critical care time: 180 minutes  Critical care time was exclusive of separately billable procedures and treating other patients.  Critical care was necessary to treat or prevent imminent or life-threatening deterioration.  Critical care was time spent personally by me (independent of midlevel providers or residents) on the following activities: development of treatment plan with patient and/or surrogate as well as nursing, discussions with consultants, evaluation of patient's response to treatment, examination of patient, obtaining history from patient or surrogate, ordering and performing treatments and interventions, ordering and review of laboratory studies, ordering and review of radiographic studies, pulse oximetry and re-evaluation of patient's condition.     Length of Stay: 1  Glori Bickers, MD  12/13/2021, 12:31 PM  Advanced Heart Failure Team Pager [phone] (M-F; 7a - 5p)  Please contact [facility] for night-coverage after hours (4p -7a ) and weekends on [email or web address]

## 2021-12-29 NOTE — Op Note (Unsigned)
[name], [name] MEDICAL RECORD NO: [id number] ACCOUNT NO: [id number] DATE OF BIRTH: 1980-09-09 FACILITY: [facility] LOCATION: [facility] PHYSICIAN: Ivin Poot III, MD  Operative Report   DATE OF PROCEDURE: 12/15/2021  OPERATION:   1.  Emergency repair of acute type A aortic dissection. 2.  Placement of Impella 5.5 temporary left ventricular assist device. 3.  Hypothermic circulatory arrest with antegrade cerebral perfusion.  PREOPERATIVE DIAGNOSES:   1.  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 2.  History of ischemic cardiomyopathy with previous multicoronary stent placements and ejection fraction of 20-25%.Severe Left ventricular concentric hypertrophy  POSTOPERATIVE DIAGNOSES:   1.  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 2.  History of ischemic cardiomyopathy with previous multicoronary stent placements and ejection fraction of 20-25%.Severe LV concentric hypertophy  SURGEON:  Tharon Aquas Adelene Idler, MD  ASSISTANT:  Jadene Pierini, PA-C.  A surgical first assistant was required due to the complexity of this operation and the standard of surgical care for cardiac surgery at this institution.  The first assistant was needed for assistance with repair of the aortic dissection with suture  management, exposure, suctioning, and general assistance.  ANESTHESIA:  General.  CLINICAL NOTE:  The patient is a 41 year old hypertensive male who presented with severe chest pain and history of coronary artery disease with low ejection fraction and previous coronary stents.  He was evaluated by Cardiology and declared a STEMI and  was loaded with Brilinta and underwent cardiac catheterization.  This demonstrated normal coronaries with patent stents and stable reduced ejection fraction.  Following the procedure, the patient had persistent pain, abdominal pain and chest pain  and  underwent a CTA.  This demonstrated type A aortic dissection.  Cardiac surgery was consulted and I assessed the patient after reviewing the CTA and his cardiac catheterization.  I discussed the situation with the patient and his mother in the ICU and  Recommended high risk  emergency repair of the type A dissection as the best possible therapy available.  However, I also expressed the fact that this was a very high risk operation in a patient with poor ventricular function, in extremis with chest and abdominal  pain and with recent loading of platelet inhibition medication.  They understood that the procedure would be quite lengthy and would be associated risks of stroke, bleeding, blood transfusion, cardiac dysfunction and arrhythmias, organ failure, infection, death.   The patient agreed to proceed with surgery and his mother supported that decision.  OPERATIVE FINDINGS:   1.  Severe coagulopathy. 2.  Primary tear just above the coronary sinuses above the commissure between the right  and non-noncoronary cusps extending from the root to descending thoracic aorta. 3.  Severe left ventricular dysfunction after separation from cardiopulmonary bypass, requiring placement of a percutaneous Impella 5.5 direct catheter through the ascending graft through the aortic valve into the LV  PROCEDURE:  The patient was brought directly from the ICU to the operating room area for preparation and line placement.  Informed consent was documented and the patient remained stable, but was very agitated and in pain.  The patient was then brought to  the operating room and placed supine on the operating table and general anesthesia was induced.  The patient remained stable.  A transesophageal echo probe was placed, which confirmed the diagnosis of aortic dissection with mild-moderate aortic  insufficiency and severe LV dysfunction with severe thickening of the LV  walls and septum.  The patient was prepped and draped  as a sterile field and a proper timeout was performed.  An incision was made in the right deltopectoral groove and the right axillary artery was dissected out and encircled with vessel loops.  Heparin 4000 units was  administered.  An 8 mm Gelweave graft was sewn end-to-side to the vessel with running 5-0 Prolene.  There was no evidence of dissection in the segment of the vessel and the graft was connected to a connector, which will be used for the cardiopulmonary  bypass circuit.  A sternal incision was then made and the pericardium was opened.  There was serosanguineous fluid in the pericardium.  There was a large indurated area of blood-stained tissue in the medial  aortic root, extending into the pulmonary artery.   Pursestring was placed in the right atrium and with the graft in the right axillary artery and the double stage venous cannula in the right atrium.  The patient was cannulated and placed on cardiopulmonary bypass after heparin was administered and ACT  was documented as being therapeutic.  A LV vent was placed in the right superior pulmonary vein and the patient was cooled to 22 degrees.  This was a gradual process.  Cardioplegic cannula was placed in the coronary sinus, but this was very challenging  due to the extreme thickness and stiffness of the patient's heart, which prevented the usual maneuvers to manipulate the catheter into the proper position.  The aorta was carefully dissected from the PA through the indurated tissue so that a clamp could be placed beneath the innominate artery.  The innominate artery was encircled with a vessel loop for later antegrade cerebral perfusion.  A clamp was placed and  cardioplegia was delivered through the retrograde catheter.  The aorta was transected beneath the clamp and the false lumen was identified down to the primary site of the tear in the aortic root as previously discovered.  There the aorta was transected  above the coronary sinuses at the  sinotubular junction.  The intimal flap was reattached using Teflon Felt and small carefully placed doses of BioGlue.  This reconstructed the aortic wall, which was sized to a 28 mm Hemashield graft.  This was sewn  end-to-end using double layer Prolene first with a running 4-0 Prolene circumferentially, which was reinforced with interrupted 4-0 pledgeted Prolene sutures on the inside along the lower portion of the circumferential anastomosis and on the outside of  the upper portion of the anastomosis. More cardioplegia was delivered to the coronaries during this process with antegrade delivery through hand-held soft cannulas  By this time, the patient had been cooled to 22 degrees and the patient was placed in a steep Trendelenburg position and the patient was exsanguinated into the pump as circulatory arrest started and antegrade cerebral perfusion began thru the axillary  artery graft at least 500 mL per minute with clamping of the innominate artery.  The perfusion of the brain was followed with cerebral oximetry continuously during this process.  The crossclamp was removed and the distal aortic dissection was resected to the proximal arch.  The intimal separation was circumferential and this was again repaired using a neointimal technique with Teflon Felt and BioGlue.  This left an aortic lumen of adequate tissue for  the end of the graft to be sewn after being divided to proper length and orientation.  A double layer of Prolene was used first with a running layer circumferentially followed by  interrupted 4-0 pledgeted Prolene sutures on the inside of  the anastomosis on the lower part of the aorta and the external pledgeted sutures on the upper outside portion of the anastomosis.  A vent was placed in the graft during this process and was used to vent air as cardiopulmonary bypass was resumed thru the  axillary artery graft and the patient was carefully reperfused and rewarmed.  It was immediately  noted that the perfusion into the graft was suboptimal indicating probable malperfusion through the axillary artery inflow and a new 20-French cannula was placed into the graft through a pursestring so the antegrade reperfusion could  be established as the arterial circuit was quickly transferred from the axillary artery site to the ascending aortic graft.  The patient was then rewarmed over 90 to 120 minutes to allow cooled temperature drop back up to 36.5 degrees.  The anastomosis of the graft were intact and the graft had a good orientation.  Temporary pacing wires were placed and inotropic support was  started due to the patient's previous preoperative LV dysfunction.  Attempts were then made to wean off cardiopulmonary bypass; however, this was unsuccessful due to mainly severe LV dysfunction from its very thick and stiff myocardium with significant  decreased systolic contractility as well.  The patient remained on bypass,  while a 10 mm graft was sewn to the ascending aortic graft in an end-to-side fashion.  This was used to pass the Impella 5.5 direct catheter through the aortic valve into the LV through an opening in the neck through the graft.  Once this was secured,  the Impella 5.5 was activated to a support level of P6-P7.  This allowed separation from cardiopulmonary bypass.  Hemodynamics were still initially suboptimal because of some RV dysfunction, which was augmented with inotropic support.  The patient had protamine administered.  Patient was still severely coagulopathic and was given clotting factors including FFP, cryoprecipitate and platelets, all of which were abnormal on the DIC screen.  A half dose of Factor 7 was also administered as well.   Chest tubes were placed in the anterior, posterior mediastinum.  The sternum was left open, but the chest was closed over the sternum using interrupted suture to close the muscle, subcutaneous and skin layers.  The axillary artery incision  was closed in  layers using Vicryl.  The patient was then transported back to intensive care in critical condition on inotropic support as well as the Impella 5.5 LVAD.  Total time for the antegrade cerebral perfusion was 39 minutes, with circulatory arrest time of 45 minutes.   SHY D: 01/10/2022 4:33:54 pm T: 12/19/2021 8:17:00 pm  JOB: [id number]/ [id number]

## 2021-12-29 NOTE — TOC CM/SW Note (Signed)
.   Transition of Care [facility]) Screening Note   Patient Details  Name: [name] Date of Birth: 12-May-1981   Transition of Care [facility]) CM/SW Contact:    Elliot Cousin, RN Phone Number: [phone] 12/23/2021, 1:00 PM    Transition of Care Department [facility]) has reviewed patient. We will continue to monitor patient advancement through interdisciplinary progression rounds. Will continue to follow for dc needs.

## 2021-12-29 NOTE — CV Procedure (Signed)
    TRANSESOPHAGEAL ECHOCARDIOGRAM   NAME:  Kylil Swopes   MRN: [id number] DOB:  1980/08/17   ADMIT DATE: 2022/01/22  INDICATIONS:  Cardiogenic shock   PROCEDURE:   Verbal consent obtained from his mother over the phone   Patient on vent already fully sedated. Timeout performed. The transesophageal probe was inserted in the esophagus and stomach without difficulty and multiple views were obtained.    COMPLICATIONS:    There were no immediate complications.  FINDINGS:  LEFT VENTRICLE: EF = 10%. Severe LVH Global HK. Impella position ok   RIGHT VENTRICLE:  Compressed Severe RV dysfunction   LEFT ATRIUM: Not well visualized  LEFT ATRIAL APPENDAGE: Not seen  RIGHT ATRIUM: Normal   AORTIC VALVE:  Normal. Mild AI (with Impella)  MITRAL VALVE:    Normal. No MR  TRICUSPID VALVE: Normal.No significant TR  PULMONIC VALVE: Not visualized  INTERATRIAL SEPTUM: No PFO or ASD.  PERICARDIUM: Small to moderate anterior effusion partially compressing RV  DESCENDING AORTA: Not visualized.     Reuel Boom Taten Merrow,MD 12:48 PM

## 2021-12-30 ENCOUNTER — Inpatient Hospital Stay (HOSPITAL_COMMUNITY): Payer: BC Managed Care – PPO

## 2021-12-30 ENCOUNTER — Encounter (HOSPITAL_COMMUNITY): Admission: EM | Disposition: E | Payer: Self-pay | Source: Home / Self Care | Attending: Cardiothoracic Surgery

## 2021-12-30 ENCOUNTER — Encounter (HOSPITAL_COMMUNITY): Payer: Self-pay | Admitting: Cardiothoracic Surgery

## 2021-12-30 DIAGNOSIS — I249 Acute ischemic heart disease, unspecified: Secondary | ICD-10-CM | POA: Diagnosis not present

## 2021-12-30 DIAGNOSIS — I5082 Biventricular heart failure: Secondary | ICD-10-CM | POA: Diagnosis not present

## 2021-12-30 DIAGNOSIS — R570 Cardiogenic shock: Secondary | ICD-10-CM | POA: Diagnosis not present

## 2021-12-30 DIAGNOSIS — I97638 Postprocedural hematoma of a circulatory system organ or structure following other circulatory system procedure: Secondary | ICD-10-CM

## 2021-12-30 HISTORY — PX: BEDSIDE EVACUATION OF HEMATOMA: SHX6535

## 2021-12-30 LAB — PREPARE FRESH FROZEN PLASMA
Unit division: 0
Unit division: 0
Unit division: 0
Unit division: 0
Unit division: 0
Unit division: 0

## 2021-12-30 LAB — PREPARE CRYOPRECIPITATE
Unit division: 0
Unit division: 0
Unit division: 0
Unit division: 0

## 2021-12-30 LAB — POCT I-STAT 7, (LYTES, BLD GAS, ICA,H+H)
Acid-Base Excess: 0 mmol/L (ref 0.0–2.0)
Acid-base deficit: 3 mmol/L — ABNORMAL HIGH (ref 0.0–2.0)
Acid-base deficit: 1 mmol/L (ref 0.0–2.0)
Acid-base deficit: 3 mmol/L — ABNORMAL HIGH (ref 0.0–2.0)
Acid-base deficit: 5 mmol/L — ABNORMAL HIGH (ref 0.0–2.0)
Acid-base deficit: 3 mmol/L — ABNORMAL HIGH (ref 0.0–2.0)
Acid-base deficit: 3 mmol/L — ABNORMAL HIGH (ref 0.0–2.0)
Acid-base deficit: 2 mmol/L (ref 0.0–2.0)
Bicarbonate: 20.8 mmol/L (ref 20.0–28.0)
Bicarbonate: 22.6 mmol/L (ref 20.0–28.0)
Bicarbonate: 23.3 mmol/L (ref 20.0–28.0)
Bicarbonate: 23.1 mmol/L (ref 20.0–28.0)
Bicarbonate: 19.8 mmol/L — ABNORMAL LOW (ref 20.0–28.0)
Bicarbonate: 23.2 mmol/L (ref 20.0–28.0)
Bicarbonate: 22.6 mmol/L (ref 20.0–28.0)
Bicarbonate: 23.7 mmol/L (ref 20.0–28.0)
Calcium, Ion: 0.9 mmol/L — ABNORMAL LOW (ref 1.15–1.40)
Calcium, Ion: 0.9 mmol/L — ABNORMAL LOW (ref 1.15–1.40)
Calcium, Ion: 0.97 mmol/L — ABNORMAL LOW (ref 1.15–1.40)
Calcium, Ion: 1.05 mmol/L — ABNORMAL LOW (ref 1.15–1.40)
Calcium, Ion: 1.02 mmol/L — ABNORMAL LOW (ref 1.15–1.40)
Calcium, Ion: 1.2 mmol/L (ref 1.15–1.40)
Calcium, Ion: 1.09 mmol/L — ABNORMAL LOW (ref 1.15–1.40)
Calcium, Ion: 1.07 mmol/L — ABNORMAL LOW (ref 1.15–1.40)
HCT: 21 % — ABNORMAL LOW (ref 39.0–52.0)
HCT: 18 % — ABNORMAL LOW (ref 39.0–52.0)
HCT: 20 % — ABNORMAL LOW (ref 39.0–52.0)
HCT: 36 % — ABNORMAL LOW (ref 39.0–52.0)
HCT: 21 % — ABNORMAL LOW (ref 39.0–52.0)
HCT: 22 % — ABNORMAL LOW (ref 39.0–52.0)
HCT: 21 % — ABNORMAL LOW (ref 39.0–52.0)
HCT: 21 % — ABNORMAL LOW (ref 39.0–52.0)
Hemoglobin: 7.1 g/dL — ABNORMAL LOW (ref 13.0–17.0)
Hemoglobin: 6.1 g/dL — CL (ref 13.0–17.0)
Hemoglobin: 6.8 g/dL — CL (ref 13.0–17.0)
Hemoglobin: 12.2 g/dL — ABNORMAL LOW (ref 13.0–17.0)
Hemoglobin: 7.1 g/dL — ABNORMAL LOW (ref 13.0–17.0)
Hemoglobin: 7.5 g/dL — ABNORMAL LOW (ref 13.0–17.0)
Hemoglobin: 7.1 g/dL — ABNORMAL LOW (ref 13.0–17.0)
Hemoglobin: 7.1 g/dL — ABNORMAL LOW (ref 13.0–17.0)
O2 Saturation: 91 %
O2 Saturation: 100 %
O2 Saturation: 100 %
O2 Saturation: 89 %
O2 Saturation: 91 %
O2 Saturation: 82 %
O2 Saturation: 80 %
O2 Saturation: 77 %
Patient temperature: 35.1
Patient temperature: 35.2
Patient temperature: 35.1
Patient temperature: 36
Patient temperature: 36.2
Patient temperature: 36.4
Patient temperature: 36.6
Patient temperature: 36.4
Potassium: 3.8 mmol/L (ref 3.5–5.1)
Potassium: 3.8 mmol/L (ref 3.5–5.1)
Potassium: 4.1 mmol/L (ref 3.5–5.1)
Potassium: 4.3 mmol/L (ref 3.5–5.1)
Potassium: 4.1 mmol/L (ref 3.5–5.1)
Potassium: 4.5 mmol/L (ref 3.5–5.1)
Potassium: 4.8 mmol/L (ref 3.5–5.1)
Potassium: 4.9 mmol/L (ref 3.5–5.1)
Sodium: 142 mmol/L (ref 135–145)
Sodium: 141 mmol/L (ref 135–145)
Sodium: 140 mmol/L (ref 135–145)
Sodium: 139 mmol/L (ref 135–145)
Sodium: 139 mmol/L (ref 135–145)
Sodium: 140 mmol/L (ref 135–145)
Sodium: 139 mmol/L (ref 135–145)
Sodium: 138 mmol/L (ref 135–145)
TCO2: 22 mmol/L (ref 22–32)
TCO2: 24 mmol/L (ref 22–32)
TCO2: 24 mmol/L (ref 22–32)
TCO2: 24 mmol/L (ref 22–32)
TCO2: 21 mmol/L — ABNORMAL LOW (ref 22–32)
TCO2: 25 mmol/L (ref 22–32)
TCO2: 24 mmol/L (ref 22–32)
TCO2: 25 mmol/L (ref 22–32)
pCO2 arterial: 27.1 mmHg — ABNORMAL LOW (ref 32–48)
pCO2 arterial: 27.7 mmHg — ABNORMAL LOW (ref 32–48)
pCO2 arterial: 28.3 mmHg — ABNORMAL LOW (ref 32–48)
pCO2 arterial: 43.4 mmHg (ref 32–48)
pCO2 arterial: 34.1 mmHg (ref 32–48)
pCO2 arterial: 44.2 mmHg (ref 32–48)
pCO2 arterial: 43.1 mmHg (ref 32–48)
pCO2 arterial: 44.9 mmHg (ref 32–48)
pH, Arterial: 7.486 — ABNORMAL HIGH (ref 7.35–7.45)
pH, Arterial: 7.514 — ABNORMAL HIGH (ref 7.35–7.45)
pH, Arterial: 7.516 — ABNORMAL HIGH (ref 7.35–7.45)
pH, Arterial: 7.329 — ABNORMAL LOW (ref 7.35–7.45)
pH, Arterial: 7.368 (ref 7.35–7.45)
pH, Arterial: 7.324 — ABNORMAL LOW (ref 7.35–7.45)
pH, Arterial: 7.325 — ABNORMAL LOW (ref 7.35–7.45)
pH, Arterial: 7.327 — ABNORMAL LOW (ref 7.35–7.45)
pO2, Arterial: 49 mmHg — ABNORMAL LOW (ref 83–108)
pO2, Arterial: 189 mmHg — ABNORMAL HIGH (ref 83–108)
pO2, Arterial: 197 mmHg — ABNORMAL HIGH (ref 83–108)
pO2, Arterial: 58 mmHg — ABNORMAL LOW (ref 83–108)
pO2, Arterial: 60 mmHg — ABNORMAL LOW (ref 83–108)
pO2, Arterial: 49 mmHg — ABNORMAL LOW (ref 83–108)
pO2, Arterial: 47 mmHg — ABNORMAL LOW (ref 83–108)
pO2, Arterial: 44 mmHg — ABNORMAL LOW (ref 83–108)

## 2021-12-30 LAB — BPAM PLATELET PHERESIS
Blood Product Expiration Date: 202306182359
Blood Product Expiration Date: 202306182359
Blood Product Expiration Date: 202306182359
Blood Product Expiration Date: 202306182359
ISSUE DATE / TIME: 202306170241
ISSUE DATE / TIME: 202306170241
ISSUE DATE / TIME: 202306170514
ISSUE DATE / TIME: 202306171134
Unit Type and Rh: 5100
Unit Type and Rh: 5100
Unit Type and Rh: 6200
Unit Type and Rh: 6200

## 2021-12-30 LAB — RENAL FUNCTION PANEL
Albumin: 2.5 g/dL — ABNORMAL LOW (ref 3.5–5.0)
Albumin: 2.3 g/dL — ABNORMAL LOW (ref 3.5–5.0)
Albumin: 2.1 g/dL — ABNORMAL LOW (ref 3.5–5.0)
Albumin: 2.2 g/dL — ABNORMAL LOW (ref 3.5–5.0)
Anion gap: 13 (ref 5–15)
Anion gap: 11 (ref 5–15)
Anion gap: 9 (ref 5–15)
Anion gap: 7 (ref 5–15)
BUN: 24 mg/dL — ABNORMAL HIGH (ref 6–20)
BUN: 22 mg/dL — ABNORMAL HIGH (ref 6–20)
BUN: 21 mg/dL — ABNORMAL HIGH (ref 6–20)
BUN: 21 mg/dL — ABNORMAL HIGH (ref 6–20)
CO2: 20 mmol/L — ABNORMAL LOW (ref 22–32)
CO2: 20 mmol/L — ABNORMAL LOW (ref 22–32)
CO2: 21 mmol/L — ABNORMAL LOW (ref 22–32)
CO2: 23 mmol/L (ref 22–32)
Calcium: 6.8 mg/dL — ABNORMAL LOW (ref 8.9–10.3)
Calcium: 6.2 mg/dL — CL (ref 8.9–10.3)
Calcium: 6.4 mg/dL — CL (ref 8.9–10.3)
Calcium: 6.6 mg/dL — ABNORMAL LOW (ref 8.9–10.3)
Chloride: 106 mmol/L (ref 98–111)
Chloride: 106 mmol/L (ref 98–111)
Chloride: 107 mmol/L (ref 98–111)
Chloride: 105 mmol/L (ref 98–111)
Creatinine, Ser: 2.36 mg/dL — ABNORMAL HIGH (ref 0.61–1.24)
Creatinine, Ser: 2.32 mg/dL — ABNORMAL HIGH (ref 0.61–1.24)
Creatinine, Ser: 2.4 mg/dL — ABNORMAL HIGH (ref 0.61–1.24)
Creatinine, Ser: 2.49 mg/dL — ABNORMAL HIGH (ref 0.61–1.24)
GFR, Estimated: 34 mL/min — ABNORMAL LOW (ref >60)
GFR, Estimated: 35 mL/min — ABNORMAL LOW (ref >60)
GFR, Estimated: 34 mL/min — ABNORMAL LOW (ref >60)
GFR, Estimated: 32 mL/min — ABNORMAL LOW (ref >60)
Glucose, Bld: 133 mg/dL — ABNORMAL HIGH (ref 70–99)
Glucose, Bld: 135 mg/dL — ABNORMAL HIGH (ref 70–99)
Glucose, Bld: 127 mg/dL — ABNORMAL HIGH (ref 70–99)
Glucose, Bld: 113 mg/dL — ABNORMAL HIGH (ref 70–99)
Phosphorus: 4.1 mg/dL (ref 2.5–4.6)
Phosphorus: 4 mg/dL (ref 2.5–4.6)
Phosphorus: 4.4 mg/dL (ref 2.5–4.6)
Phosphorus: 4.5 mg/dL (ref 2.5–4.6)
Potassium: 3.7 mmol/L (ref 3.5–5.1)
Potassium: 3.8 mmol/L (ref 3.5–5.1)
Potassium: 4.5 mmol/L (ref 3.5–5.1)
Potassium: 4.8 mmol/L (ref 3.5–5.1)
Sodium: 139 mmol/L (ref 135–145)
Sodium: 137 mmol/L (ref 135–145)
Sodium: 137 mmol/L (ref 135–145)
Sodium: 135 mmol/L (ref 135–145)

## 2021-12-30 LAB — BPAM CRYOPRECIPITATE
Blood Product Expiration Date: 202306171007
Blood Product Expiration Date: 202306171007
Blood Product Expiration Date: 202306171725
Blood Product Expiration Date: 202306171725
ISSUE DATE / TIME: 202306170410
ISSUE DATE / TIME: 202306170410
ISSUE DATE / TIME: 202306171245
ISSUE DATE / TIME: 202306171245
Unit Type and Rh: 6200
Unit Type and Rh: 6200
Unit Type and Rh: 5100
Unit Type and Rh: 5100

## 2021-12-30 LAB — COOXEMETRY PANEL
Carboxyhemoglobin: 1.1 % (ref 0.5–1.5)
Carboxyhemoglobin: 1.2 % (ref 0.5–1.5)
Methemoglobin: <0.7 % (ref 0.0–1.5)
Methemoglobin: 1.2 % (ref 0.0–1.5)
O2 Saturation: 42.3 %
O2 Saturation: 52.3 %
Total hemoglobin: 7.9 g/dL — ABNORMAL LOW (ref 12.0–16.0)
Total hemoglobin: 8.1 g/dL — ABNORMAL LOW (ref 12.0–16.0)

## 2021-12-30 LAB — BPAM FFP
Blood Product Expiration Date: 202306222359
Blood Product Expiration Date: 202306222359
Blood Product Expiration Date: 202306222359
Blood Product Expiration Date: 202306222359
Blood Product Expiration Date: 202306212359
Blood Product Expiration Date: 202306222359
Blood Product Expiration Date: 202306222359
Blood Product Expiration Date: 202306222359
ISSUE DATE / TIME: 202306170313
ISSUE DATE / TIME: 202306170313
ISSUE DATE / TIME: 202306170313
ISSUE DATE / TIME: 202306170313
ISSUE DATE / TIME: 202306171538
ISSUE DATE / TIME: 202306171538
ISSUE DATE / TIME: 202306171748
ISSUE DATE / TIME: 202306171748
Unit Type and Rh: 7300
Unit Type and Rh: 7300
Unit Type and Rh: 7300
Unit Type and Rh: 7300
Unit Type and Rh: 600
Unit Type and Rh: 6200
Unit Type and Rh: 7300
Unit Type and Rh: 7300

## 2021-12-30 LAB — PROTIME-INR
INR: 1.1 (ref 0.8–1.2)
INR: 1.3 — ABNORMAL HIGH (ref 0.8–1.2)
INR: 1.7 — ABNORMAL HIGH (ref 0.8–1.2)
Prothrombin Time: 14.5 seconds (ref 11.4–15.2)
Prothrombin Time: 16.2 seconds — ABNORMAL HIGH (ref 11.4–15.2)
Prothrombin Time: 19.5 seconds — ABNORMAL HIGH (ref 11.4–15.2)

## 2021-12-30 LAB — CBC
HCT: 22.9 % — ABNORMAL LOW (ref 39.0–52.0)
HCT: 24.1 % — ABNORMAL LOW (ref 39.0–52.0)
HCT: 23.4 % — ABNORMAL LOW (ref 39.0–52.0)
HCT: 23.9 % — ABNORMAL LOW (ref 39.0–52.0)
Hemoglobin: 8.2 g/dL — ABNORMAL LOW (ref 13.0–17.0)
Hemoglobin: 8.7 g/dL — ABNORMAL LOW (ref 13.0–17.0)
Hemoglobin: 8.4 g/dL — ABNORMAL LOW (ref 13.0–17.0)
Hemoglobin: 8.2 g/dL — ABNORMAL LOW (ref 13.0–17.0)
MCH: 30 pg (ref 26.0–34.0)
MCH: 29.5 pg (ref 26.0–34.0)
MCH: 29.4 pg (ref 26.0–34.0)
MCH: 29.3 pg (ref 26.0–34.0)
MCHC: 35.8 g/dL (ref 30.0–36.0)
MCHC: 36.1 g/dL — ABNORMAL HIGH (ref 30.0–36.0)
MCHC: 35.9 g/dL (ref 30.0–36.0)
MCHC: 34.3 g/dL (ref 30.0–36.0)
MCV: 83.9 fL (ref 80.0–100.0)
MCV: 81.7 fL (ref 80.0–100.0)
MCV: 81.8 fL (ref 80.0–100.0)
MCV: 85.4 fL (ref 80.0–100.0)
Platelets: 52 10*3/uL — ABNORMAL LOW (ref 150–400)
Platelets: 78 10*3/uL — ABNORMAL LOW (ref 150–400)
Platelets: 78 10*3/uL — ABNORMAL LOW (ref 150–400)
Platelets: 64 10*3/uL — ABNORMAL LOW (ref 150–400)
RBC: 2.73 MIL/uL — ABNORMAL LOW (ref 4.22–5.81)
RBC: 2.95 MIL/uL — ABNORMAL LOW (ref 4.22–5.81)
RBC: 2.86 MIL/uL — ABNORMAL LOW (ref 4.22–5.81)
RBC: 2.8 MIL/uL — ABNORMAL LOW (ref 4.22–5.81)
RDW: 15.3 % (ref 11.5–15.5)
RDW: 14.9 % (ref 11.5–15.5)
RDW: 15.2 % (ref 11.5–15.5)
RDW: 16.3 % — ABNORMAL HIGH (ref 11.5–15.5)
WBC: 5.1 10*3/uL (ref 4.0–10.5)
WBC: 5.8 10*3/uL (ref 4.0–10.5)
WBC: 6 10*3/uL (ref 4.0–10.5)
WBC: 9.8 10*3/uL (ref 4.0–10.5)
nRBC: 1 % — ABNORMAL HIGH (ref 0.0–0.2)
nRBC: 0.9 % — ABNORMAL HIGH (ref 0.0–0.2)
nRBC: 1.5 % — ABNORMAL HIGH (ref 0.0–0.2)
nRBC: 3.3 % — ABNORMAL HIGH (ref 0.0–0.2)

## 2021-12-30 LAB — PREPARE PLATELET PHERESIS
Unit division: 0
Unit division: 0
Unit division: 0
Unit division: 0

## 2021-12-30 LAB — MAGNESIUM
Magnesium: 2.1 mg/dL (ref 1.7–2.4)
Magnesium: 2 mg/dL (ref 1.7–2.4)
Magnesium: 2 mg/dL (ref 1.7–2.4)
Magnesium: 2.1 mg/dL (ref 1.7–2.4)

## 2021-12-30 LAB — PREPARE RBC (CROSSMATCH)

## 2021-12-30 LAB — COMPREHENSIVE METABOLIC PANEL
ALT: 184 U/L — ABNORMAL HIGH (ref 0–44)
AST: 1195 U/L — ABNORMAL HIGH (ref 15–41)
Albumin: 2.1 g/dL — ABNORMAL LOW (ref 3.5–5.0)
Alkaline Phosphatase: 31 U/L — ABNORMAL LOW (ref 38–126)
Anion gap: 9 (ref 5–15)
BUN: 21 mg/dL — ABNORMAL HIGH (ref 6–20)
CO2: 20 mmol/L — ABNORMAL LOW (ref 22–32)
Calcium: 6.7 mg/dL — ABNORMAL LOW (ref 8.9–10.3)
Chloride: 110 mmol/L (ref 98–111)
Creatinine, Ser: 2.31 mg/dL — ABNORMAL HIGH (ref 0.61–1.24)
GFR, Estimated: 35 mL/min — ABNORMAL LOW (ref >60)
Glucose, Bld: 136 mg/dL — ABNORMAL HIGH (ref 70–99)
Potassium: 4.1 mmol/L (ref 3.5–5.1)
Sodium: 139 mmol/L (ref 135–145)
Total Bilirubin: 1.3 mg/dL — ABNORMAL HIGH (ref 0.3–1.2)
Total Protein: 3.3 g/dL — ABNORMAL LOW (ref 6.5–8.1)

## 2021-12-30 LAB — FIBRINOGEN
Fibrinogen: 247 mg/dL (ref 210–475)
Fibrinogen: 249 mg/dL (ref 210–475)
Fibrinogen: 326 mg/dL (ref 210–475)
Fibrinogen: 360 mg/dL (ref 210–475)

## 2021-12-30 LAB — GLUCOSE, CAPILLARY
Glucose-Capillary: 139 mg/dL — ABNORMAL HIGH (ref 70–99)
Glucose-Capillary: 141 mg/dL — ABNORMAL HIGH (ref 70–99)
Glucose-Capillary: 142 mg/dL — ABNORMAL HIGH (ref 70–99)
Glucose-Capillary: 150 mg/dL — ABNORMAL HIGH (ref 70–99)
Glucose-Capillary: 144 mg/dL — ABNORMAL HIGH (ref 70–99)
Glucose-Capillary: 138 mg/dL — ABNORMAL HIGH (ref 70–99)
Glucose-Capillary: 157 mg/dL — ABNORMAL HIGH (ref 70–99)
Glucose-Capillary: 138 mg/dL — ABNORMAL HIGH (ref 70–99)
Glucose-Capillary: 117 mg/dL — ABNORMAL HIGH (ref 70–99)
Glucose-Capillary: 114 mg/dL — ABNORMAL HIGH (ref 70–99)
Glucose-Capillary: 126 mg/dL — ABNORMAL HIGH (ref 70–99)
Glucose-Capillary: 135 mg/dL — ABNORMAL HIGH (ref 70–99)
Glucose-Capillary: 114 mg/dL — ABNORMAL HIGH (ref 70–99)
Glucose-Capillary: 122 mg/dL — ABNORMAL HIGH (ref 70–99)
Glucose-Capillary: 110 mg/dL — ABNORMAL HIGH (ref 70–99)

## 2021-12-30 LAB — HEMOGLOBIN AND HEMATOCRIT, BLOOD
HCT: 25.4 % — ABNORMAL LOW (ref 39.0–52.0)
Hemoglobin: 9.2 g/dL — ABNORMAL LOW (ref 13.0–17.0)

## 2021-12-30 LAB — LACTATE DEHYDROGENASE: LDH: 1238 U/L — ABNORMAL HIGH (ref 98–192)

## 2021-12-30 LAB — AMYLASE: Amylase: 450 U/L — ABNORMAL HIGH (ref 28–100)

## 2021-12-30 LAB — APTT
aPTT: 40 seconds — ABNORMAL HIGH (ref 24–36)
aPTT: 44 seconds — ABNORMAL HIGH (ref 24–36)
aPTT: 38 seconds — ABNORMAL HIGH (ref 24–36)

## 2021-12-30 LAB — BLOOD PRODUCT ORDER (VERBAL) VERIFICATION

## 2021-12-30 LAB — ECHO INTRAOPERATIVE TEE
Height: 74 in
Weight: 2960 oz

## 2021-12-30 LAB — LACTIC ACID, PLASMA
Lactic Acid, Venous: 3 mmol/L (ref 0.5–1.9)
Lactic Acid, Venous: 2.6 mmol/L (ref 0.5–1.9)
Lactic Acid, Venous: 2.7 mmol/L (ref 0.5–1.9)

## 2021-12-30 SURGERY — BEDSIDE EVACUATION OF HEMATOMA
Anesthesia: LOCAL | Site: Chest

## 2021-12-30 MED ORDER — AMIODARONE LOAD VIA INFUSION
150.0000 mg | Freq: Once | INTRAVENOUS | Status: AC
  Administered 2021-12-30: 150 mg via INTRAVENOUS
  Filled 2021-12-30: qty 83.34

## 2021-12-30 MED ORDER — ALBUMIN HUMAN 5 % IV SOLN
INTRAVENOUS | Status: AC
  Filled 2021-12-30: qty 250

## 2021-12-30 MED ORDER — ORAL CARE MOUTH RINSE
15.0000 mL | OROMUCOSAL | Status: DC
  Administered 2021-12-30 – 2021-12-31 (×13): 15 mL via OROMUCOSAL

## 2021-12-30 MED ORDER — SODIUM CHLORIDE 0.9 % IV SOLN
1.0000 g | Freq: Three times a day (TID) | INTRAVENOUS | Status: DC
  Administered 2021-12-30 – 2021-12-31 (×3): 1 g via INTRAVENOUS
  Filled 2021-12-30 (×5): qty 20

## 2021-12-30 MED ORDER — ALBUMIN HUMAN 5 % IV SOLN
12.5000 g | Freq: Once | INTRAVENOUS | Status: AC
Start: 2021-12-30 — End: 2021-12-30
  Administered 2021-12-30: 12.5 g via INTRAVENOUS

## 2021-12-30 MED ORDER — FAMOTIDINE IN NACL 20-0.9 MG/50ML-% IV SOLN
20.0000 mg | Freq: Two times a day (BID) | INTRAVENOUS | Status: AC
  Administered 2021-12-30 – 2021-12-31 (×2): 20 mg via INTRAVENOUS
  Filled 2021-12-30 (×2): qty 50

## 2021-12-30 MED ORDER — MAGNESIUM SULFATE 2 GM/50ML IV SOLN
2.0000 g | Freq: Once | INTRAVENOUS | Status: AC
Start: 2021-12-30 — End: 2021-12-30
  Administered 2021-12-30: 2 g via INTRAVENOUS
  Filled 2021-12-30: qty 50

## 2021-12-30 MED ORDER — SODIUM CHLORIDE 0.9% IV SOLUTION: Freq: Once | INTRAVENOUS | Status: AC

## 2021-12-30 MED ORDER — SODIUM CHLORIDE 0.9 % IV SOLN
0.5000 g | Freq: Once | INTRAVENOUS | Status: AC
  Administered 2021-12-30: 0.5 g via INTRAVENOUS
  Filled 2021-12-30: qty 5

## 2021-12-30 MED ORDER — ORAL CARE MOUTH RINSE: 15.0000 mL | OROMUCOSAL | Status: DC | PRN

## 2021-12-30 MED ORDER — CALCIUM CHLORIDE 10 % IV SOLN
1.0000 g | Freq: Once | INTRAVENOUS | Status: AC
  Administered 2021-12-30: 1 g via INTRAVENOUS

## 2021-12-30 SURGICAL SUPPLY — 14 items
COVER BACK TABLE 60X90IN (DRAPES) ×1 IMPLANT
DRAPE HALF SHEET 40X57 (DRAPES) ×1 IMPLANT
DRSG AQUACEL AG ADV 3.5X14 (GAUZE/BANDAGES/DRESSINGS) ×1 IMPLANT
GAUZE SPONGE 4X4 12PLY STRL LF (GAUZE/BANDAGES/DRESSINGS) ×1 IMPLANT
GOWN STRL REUS W/ TWL LRG LVL3 (GOWN DISPOSABLE) IMPLANT
GOWN STRL REUS W/TWL LRG LVL3 (GOWN DISPOSABLE) ×4
POWDER SURGICEL 3.0 GRAM (HEMOSTASIS) ×2 IMPLANT
SPONGE LAP 18X18 X RAY DECT (DISPOSABLE) ×1 IMPLANT
SUT VIC AB 1 CTX 18 (SUTURE) ×2 IMPLANT
SUT VIC AB 3-0 X1 27 (SUTURE) ×1 IMPLANT
SYR BULB IRRIG 60ML STRL (SYRINGE) ×1 IMPLANT
TOWEL GREEN STERILE FF (TOWEL DISPOSABLE) ×1 IMPLANT
TUBE CONNECTING 12X1/4 (SUCTIONS) ×1 IMPLANT
YANKAUER SUCT BULB TIP NO VENT (SUCTIONS) ×1 IMPLANT

## 2021-12-30 NOTE — Progress Notes (Signed)
Notified Dr. Maren Beach of ABG results; will get pcxr and increase PEEP to 10 and Oxygen to 100%.

## 2021-12-30 NOTE — Procedures (Signed)
Procedure-bedside ICU mediastinal exploration  Patient developed hemodynamic changes of early tamponade with chest x-ray changes of mediastinal hematoma.  Patient needed expeditious mediastinal exploration and removal of hematoma.  The procedure was performed in sterile field sterile prep and drape and instrumentation. The patient's IV sedation was increased before any incisions were made.  The chest incision was opened through the skin and soft tissues.  The sternum was left open at the original procedure due to RV dysfunction.  Hematoma was removed from the mediastinum and the field was irrigated.  There is diffuse oozing and no surgical site bleeding.  A new 28 French chest tube was placed into the right pleural space to drain a bloody right pleural effusion.  The patient's hemodynamics improved after evacuation of hematoma and inotropic support requirements were reduced.  The incision was closed leaving his sternum open with a interrupted layer of suture for the pectoralis fascia and running suture for the subcutaneous and skin layers.  Sterile dressing was applied.

## 2021-12-30 NOTE — Consult Note (Signed)
[name], MRN:  [id number], DOB:  [birth year], LOS: 2 ADMISSION DATE:  2022/01/22, CONSULTATION DATE:  12/19/2021 REFERRING MD:  Bensimhon, CHIEF COMPLAINT:  chest pain   History of Present Illness:  41 year old man w/ hx HTN/CAD/PAD p/w chest pain.  Cath neg.  CTA showing Type A dissection.  Underwent repair and impella 5.5 placement.  Postop course complicated by mediastinal hematoma requiring bedside redo sternotomy with partial closure.  Required good deal of blood products and now having issues with oxygenation on vent for which PCCM is consulted.  Pertinent  Medical History  CAD HTN PVD Tobacco abuse  Significant Hospital Events: Including procedures, antibiotic start and stop dates in addition to other pertinent events   6/16 LHC 6/16-6/17 type A dissection repair, impella placement 6/18 bedside redo sternotomy with hematoma evacuation  Interim History / Subjective:  Consulted  Objective   Blood pressure 92/63, pulse 89, temperature (!) 96.3 F (35.7 C), resp. rate (!) 0, height 6\' 2"  (1.88 m), weight 83.9 kg, SpO2 (!) 87 %. PAP: (21-36)/(17-29) 29/24 CVP:  [13 mmHg-18 mmHg] 16 mmHg CO:  [2.7 L/min-3.5 L/min] 3.4 L/min CI:  [1.4 L/min/m2-1.7 L/min/m2] 1.6 L/min/m2  Vent Mode: PRVC FiO2 (%):  [60 %-100 %] 100 % Set Rate:  [12 bmp-15 bmp] 15 bmp Vt Set:  [650 mL-820 mL] 650 mL PEEP:  [5 cmH20-10 cmH20] 10 cmH20 Pressure Support:  [10 cmH20] 10 cmH20 Plateau Pressure:  [28 cmH20-34 cmH20] 32 cmH20   Intake/Output Summary (Last 24 hours) at 12/27/2021 2321 Last data filed at 12/17/2021 2300 Gross per 24 hour  Intake 13060.66 ml  Output 4492 ml  Net 8568.66 ml   Filed Weights   22-Jan-2022 1700  Weight: 83.9 kg    Examination: General: Heavily sedated on vent HENT: ETT minimal secretions Lungs: Distant, triggers vent Cardiovascular: sternotomy site CDI, impella hum Abdomen: Soft, hypoactive BS Extremities: anasarca Neuro: RASS -5 Skin: no rashes  CXR  repeat just now: not bad edema type pattern but PEEP has been increased  Resolved Hospital Problem list   N/a  Assessment & Plan:  Acute hypoxemic respiratory failure due to some combination fluid overload, TRALI, ARDS.   Type A dissection post repair  Post op severe biventricular failure  Hx CAD  Acute renal failure on CRRT  - More generous with PEEP, limit DP to < 20 if able - Add iNO 20 ppm to help VQ mismatch, will have to watch how this affect hemodynamics - Push fluid removal - Heavy sedation as ordered, low threshold to initiate NMB protocol - All inotrope/impella/rescuscitation efforts per CHF and TCTS - Plans discussed with RT, bedside RN, and CHF team - Will follow, agree grave prognosis  Best Practice (right click and "Reselect all SmartList Selections" daily)  Per primary  Labs   CBC: Recent Labs  Lab 01/22/2022 1645 01/22/22 1716 12/26/2021 1813 12/23/2021 1954 01/08/2022 2344 12/19/2021 0301 12/14/2021 0312 12/25/2021 0721 12/20/2021 0906 12/19/2021 0908 12/27/2021 1500 01/05/2022 1632 12/20/2021 1851 12/27/2021 2042 01/09/2022 2046  WBC 12.9*   < > 8.1  --   --  5.1  --  5.8 6.0  --   --   --   --   --  9.8  NEUTROABS 7.2  --   --   --   --   --   --   --   --   --   --   --   --   --   --  HGB 11.9*   < > 9.0* 6.2*   < > 8.2*   < > 8.7* 8.4*   < > 9.2* 7.5* 7.1* 7.1* 8.2*  HCT 36.1*   < > 25.4* 17.4*   < > 22.9*   < > 24.1* 23.4*   < > 25.4* 22.0* 21.0* 21.0* 23.9*  MCV 90.3   < > 85.8  --   --  83.9  --  81.7 81.8  --   --   --   --   --  85.4  PLT 231   < > 67* 116*  --  52*  --  78* 78*  --   --   --   --   --  64*   < > = values in this interval not displayed.    Basic Metabolic Panel: Recent Labs  Lab 01/02/2022 2000 12/15/2021 2344 01/05/2022 0301 12/13/2021 0312 12/14/2021 0721 12/26/2021 0908 01/03/2022 1047 12/27/2021 1318 01/08/2022 1500 12/14/2021 1632 01/02/2022 1851 12/27/2021 2042 01/07/2022 2046  NA 142   < > 139   < > 137   < > 139   < > 137 140 139 138 135  K 3.5    < > 3.7   < > 3.8   < > 4.1   < > 4.5 4.5 4.8 4.9 4.8  CL 109  --  106  --  106  --  110  --  107  --   --   --  105  CO2 20*  --  20*  --  20*  --  20*  --  21*  --   --   --  23  GLUCOSE 170*  --  133*  --  135*  --  136*  --  127*  --   --   --  113*  BUN 22*  --  24*  --  22*  --  21*  --  21*  --   --   --  21*  CREATININE 2.21*  --  2.36*  --  2.32*  --  2.31*  --  2.40*  --   --   --  2.49*  CALCIUM 6.5*  --  6.8*  --  6.2*  --  6.7*  --  6.4*  --   --   --  6.6*  MG 1.8  --  2.1  --  2.0  --  2.0  --   --   --   --   --  2.1  PHOS 3.7  --  4.1  --  4.0  --   --   --  4.4  --   --   --  4.5   < > = values in this interval not displayed.   GFR: Estimated Creatinine Clearance: 44.9 mL/min (A) (by C-G formula based on SCr of 2.49 mg/dL (H)). Recent Labs  Lab 12/20/2021 2000 01/05/2022 0301 01/08/2022 0721 12/21/2021 0906 12/22/2021 1500 01/07/2022 2046  WBC  --  5.1 5.8 6.0  --  9.8  LATICACIDVEN 4.7* 3.0* 2.6*  --  2.7*  --     Liver Function Tests: Recent Labs  Lab January 03, 2022 1645 01/05/2022 2000 01/08/2022 0301 12/19/2021 0721 01/06/2022 1047 01/02/2022 1500 01/02/2022 2046  AST 17  --   --   --  1,195*  --   --   ALT 14  --   --   --  184*  --   --   ALKPHOS 59  --   --   --  31*  --   --   BILITOT 1.1  --   --   --  1.3*  --   --   PROT 6.5  --   --   --  3.3*  --   --   ALBUMIN 4.1   < > 2.5* 2.3* 2.1* 2.1* 2.2*   < > = values in this interval not displayed.   Recent Labs  Lab 2022-01-14 1047  AMYLASE 450*   No results for input(s): "AMMONIA" in the last 168 hours.  ABG    Component Value Date/Time   PHART 7.327 (L) 2022/01/14 2042   PCO2ART 44.9 01-14-2022 2042   PO2ART 44 (L) 2022-01-14 2042   HCO3 23.7 01-14-22 2042   TCO2 25 01-14-2022 2042   ACIDBASEDEF 2.0 01/14/2022 2042   O2SAT 52.3 01/14/2022 2046     Coagulation Profile: Recent Labs  Lab 12/15/2021 1614 01/03/2022 1954 Jan 14, 2022 0311 01/14/22 0721 2022-01-14 2046  INR 1.6* 1.0 1.1 1.3* 1.7*    Cardiac  Enzymes: No results for input(s): "CKTOTAL", "CKMB", "CKMBINDEX", "TROPONINI" in the last 168 hours.  HbA1C: Hgb A1c MFr Bld  Date/Time Value Ref Range Status  01/04/2022 04:45 PM 5.4 4.8 - 5.6 % Final    Comment:    (NOTE) Pre diabetes:          5.7%-6.4%  Diabetes:              >6.4%  Glycemic control for   <7.0% adults with diabetes     CBG: Recent Labs  Lab Jan 14, 2022 1512 01-14-2022 1604 Jan 14, 2022 1802 01/14/2022 1900 14-Jan-2022 2040  GLUCAP 126* 135* 114* 122* 110*    Review of Systems:   N/A  Past Medical History:  He,  has a past medical history of CAD (coronary artery disease), Heart attack (HCC), Hypertension, PVD (peripheral vascular disease) (HCC), and Tobacco abuse.   Surgical History:   Past Surgical History:  Procedure Laterality Date   PERIPHERAL VASCULAR CATHETERIZATION N/A 07/18/2015   Procedure: Abdominal Aortogram;  Surgeon: Yates Decamp, MD;  Location: [facility] INVASIVE CV LAB;  Service: Cardiovascular;  Laterality: N/A;   PERIPHERAL VASCULAR CATHETERIZATION  07/18/2015   Procedure: Lower Extremity Angiography;  Surgeon: Yates Decamp, MD;  Location[facility] INVASIVE CV LAB;  Service: Cardiovascular;;   PERIPHERAL VASCULAR CATHETERIZATION  07/18/2015   Procedure: Peripheral Vascular Intervention;  Surgeon: Yates Decamp, MD;  Location: [facility] INVASIVE CV LAB;  Service: Cardiovascular;;  RIGHT COMMON ILIAC   PLACEMENT OF IMPELLA LEFT VENTRICULAR ASSIST DEVICE N/A 12/22/2021   Procedure: PLACEMENT OF IMPELLA LEFT VENTRICULAR ASSIST DEVICE;  Surgeon: Lovett Sox, MD;  Location: [facility] OR;  Service: Open Heart Surgery;  Laterality: N/A;   THORACIC AORTIC ANEURYSM REPAIR N/A 12/19/2021   Procedure: REPAIR TYPE A DISSECTION;  Surgeon: Lovett Sox, MD;  Location: [facility] OR;  Service: Open Heart Surgery;  Laterality: N/A;     Social History:   reports that he has been smoking. He does not have any smokeless tobacco history on file. He reports current drug use. Drug: Marijuana. He reports that  he does not drink alcohol.   Family History:  His family history is negative for CAD.   Allergies No Known Allergies   Home Medications  Prior to Admission medications   Medication Sig Start Date End Date Taking? Authorizing Provider  aspirin EC 81 MG tablet Take 1 tablet (81 mg total) by mouth daily. 07/18/15   Yates Decamp, MD  carvedilol (COREG) 12.5 MG tablet Take 1 tablet (12.5  mg total) by mouth 2 (two) times daily with a meal. 07/18/15   Yates Decamp, MD  clopidogrel (PLAVIX) 75 MG tablet Take 1 tablet (75 mg total) by mouth daily. 07/18/15   Yates Decamp, MD     Critical care time: 32 min

## 2021-12-30 NOTE — Progress Notes (Signed)
Nitric started per MD order at 20 PPM at this time.

## 2021-12-30 NOTE — Progress Notes (Signed)
Advanced Heart Failure Rounding Note  PCP-Cardiologist: None   Subjective:    Events: 6/16 Presented with CP. Cath -> stents patent non-obs CAD. CT with Type A aortic dissection 6/16 Operative repair with Impella 5.5 placement and post-op shock 6/17 RP placement for RV dysfunction. CVVHD started 6/18 Developed tamponade. Chest re-explored. Diffuse coagulopathy  Remains intubated/sedated on bi-Pella support.   On NE 40 Epi 22 VP 0.04 Milrinone 0.5  Impella RP P-7 Flow 3.3  Good waveform Impella CP P-6 Flow 3.9 Good waveform   Swan  CVP 11 PAP 28/24  CI 2.2 SVR 1010   Objective:   Weight Range: 83.9 kg Body mass index is 23.75 kg/m.   Vital Signs:   Temp:  [88.3 F (31.3 C)-97.3 F (36.3 C)] 95.2 F (35.1 C) (06/18 0800) Pulse Rate:  [0-209] 88 (06/18 0800) Resp:  [6-22] 15 (06/18 0800) BP: (48-124)/(23-91) 88/68 (06/18 0746) SpO2:  [19 %-100 %] 100 % (06/18 0800) Arterial Line BP: (54-163)/(44-89) 83/67 (06/18 0800) FiO2 (%):  [60 %-100 %] 100 % (06/18 0746)    Weight change: Filed Weights   01/04/2022 1700  Weight: 83.9 kg    Intake/Output:   Intake/Output Summary (Last 24 hours) at 12/13/2021 0917 Last data filed at 01/02/2022 0900 Gross per 24 hour  Intake 15435.99 ml  Output 8277 ml  Net 7158.99 ml      Physical Exam    General:  Intubated/sedated + scleral edema HEENT: Normal + ETT Neck: Supple. RIJ swan .  Cor: Sternal dressing. Aortic impella. + CTsRegular rate & rhythm. No rubs, gallops or murmurs. Lungs: Coarse Abdomen: Soft, nontender, nondistended. . Hypoactive bowel sounds. Extremities: No cyanosis, clubbing, rash, 2+ edema  +RFV impella + LFV HD cath Neuro: Sedated   Telemetry   A-paced 90s Personally reviewed   Labs    CBC Recent Labs    01/10/2022 1645 01/11/2022 1716 12/27/2021 0301 12/25/2021 0312 12/22/2021 0531 12/17/2021 0721  WBC 12.9*   < > 5.1  --   --  5.8  NEUTROABS 7.2  --   --   --   --   --   HGB 11.9*   < >  8.2*   < > 6.1* 8.7*  HCT 36.1*   < > 22.9*   < > 18.0* 24.1*  MCV 90.3   < > 83.9  --   --  81.7  PLT 231   < > 52*  --   --  78*   < > = values in this interval not displayed.   Basic Metabolic Panel Recent Labs    78/93/81 0301 12/17/2021 0312 01/01/2022 0531 12/22/2021 0721  NA 139   < > 141 137  K 3.7   < > 3.8 3.8  CL 106  --   --  106  CO2 20*  --   --  20*  GLUCOSE 133*  --   --  135*  BUN 24*  --   --  22*  CREATININE 2.36*  --   --  2.32*  CALCIUM 6.8*  --   --  6.2*  MG 2.1  --   --  2.0  PHOS 4.1  --   --  4.0   < > = values in this interval not displayed.   Liver Function Tests Recent Labs    12/16/2021 1645 01/06/2022 2000 01/09/2022 0301 12/21/2021 0721  AST 17  --   --   --   ALT 14  --   --   --  ALKPHOS 59  --   --   --   BILITOT 1.1  --   --   --   PROT 6.5  --   --   --   ALBUMIN 4.1   < > 2.5* 2.3*   < > = values in this interval not displayed.   No results for input(s): "LIPASE", "AMYLASE" in the last 72 hours. Cardiac Enzymes No results for input(s): "CKTOTAL", "CKMB", "CKMBINDEX", "TROPONINI" in the last 72 hours.  BNP: BNP (last 3 results) No results for input(s): "BNP" in the last 8760 hours.  ProBNP (last 3 results) No results for input(s): "PROBNP" in the last 8760 hours.   D-Dimer No results for input(s): "DDIMER" in the last 72 hours. Hemoglobin A1C Recent Labs    January 23, 2022 1645  HGBA1C 5.4   Fasting Lipid Panel Recent Labs    January 23, 2022 1645  CHOL 170  HDL 48  LDLCALC 109*  TRIG 64  CHOLHDL 3.5   Thyroid Function Tests No results for input(s): "TSH", "T4TOTAL", "T3FREE", "THYROIDAB" in the last 72 hours.  Invalid input(s): "FREET3"  Other results:   Imaging    DG Chest Port 1 View  Result Date: 12/15/2021 CLINICAL DATA:  Aortic dissection EXAM: PORTABLE CHEST 1 VIEW COMPARISON:  Chest x-ray dated December 30, 2021 FINDINGS: Unchanged position of ET tube. Right PA catheter tip projects over the main pulmonary artery. Gastric  decompression tube courses below the diaphragm. Right-sided chest tube. Temporary ventricular assist devices are unchanged in position. Small bilateral pleural effusions. No evidence of pneumothorax. IMPRESSION: 1. Stable support devices. 2. Small bilateral pleural effusions. Electronically Signed   By: Allegra Lai M.D.   On: 12/28/2021 09:07   DG CHEST PORT 1 VIEW  Result Date: 01/11/2022 CLINICAL DATA:  Hemodynamic changes of early tamponade. EXAM: PORTABLE CHEST 1 VIEW COMPARISON:  Chest radiograph dated 12/24/2021. FINDINGS: Interval placement of a chest tube with tip in the right upper lobe. There has been retraction of the right IJ Swan-Ganz catheter with tip likely in the main pulmonary trunk. Additional support apparatus in similar position. Decrease in the size of the right pleural effusion with improved aeration of the right lung. Similar aeration of the left lung. No pneumothorax. Slight interval decrease in the mediastinal widening since the prior radiograph. Stable cardiac silhouette. No acute osseous pathology. IMPRESSION: 1. Interval placement of a right-sided chest tube with significant decrease or resolution of the right pleural effusion and improvement of right lung aeration compared to prior radiograph. 2. Retraction of the right IJ Swan-Ganz catheter with tip likely in the main pulmonary trunk. 3. Decrease in the mediastinal widening. Electronically Signed   By: Elgie Collard M.D.   On: 12/23/2021 02:52   DG CHEST PORT 1 VIEW  Result Date: 12/26/2021 CLINICAL DATA:  Acute coronary syndrome EXAM: PORTABLE CHEST 1 VIEW COMPARISON:  01/07/2022 FINDINGS: Endotracheal and enteric tubes, right Swan-Ganz catheter, superior approach Impella device projecting over the expected location of the right ventricle. Inferior approach Impella device projecting through the right ventricle with tip over the outflow tract region. Diffuse cardiac enlargement. Atelectasis in the lung bases. Small left  pleural effusion. Increasing moderate-sized right pleural effusion. No visible pneumothorax. Subcutaneous emphysema and surgical clips in the base of the neck on the right, likely postoperative. IMPRESSION: Appliances appear in satisfactory position as discussed. Small left and moderate right pleural effusions, increasing since prior study. Basal atelectasis. Electronically Signed   By: Burman Nieves M.D.   On: 12/30/2021 00:42  CARDIAC CATHETERIZATION  Result Date: 12/16/2021 Successful placement of Impella RP and temporary HD cath.   PERIPHERAL VASCULAR CATHETERIZATION  Result Date: 01/02/2022 Successful placement of Impella RP and temporary HD cath.   DG Chest Port 1 View  Result Date: 12/19/2021 CLINICAL DATA:  Aortic dissection EXAM: PORTABLE CHEST 1 VIEW COMPARISON:  Chest x-ray dated March 25, 2011 FINDINGS: ET tube tip is at the thoracic inlet, approximally 7.5 cm from the carina. Right IJ approach PA catheter projects over the expected area of the main pulmonary artery. Impella device overlies the expected area of the right ventricle. Gastric decompression tube tip and side port project over the expected area of the stomach. Mediastinal drains in place. Cardiac and mediastinal contours within normal limits. Small layering right pleural effusion. No evidence of pneumothorax. IMPRESSION: 1. ET tube tip is at the thoracic inlet, approximally 7.5 cm from the carina. 2. Small layering right pleural effusion. Electronically Signed   By: Allegra Lai M.D.   On: 01/11/2022 10:12     Medications:     Scheduled Medications:  sodium chloride   Intravenous Once   sodium chloride   Intravenous Once   sodium chloride   Intravenous Once   sodium chloride   Intravenous Once   sodium chloride   Intravenous Once   acetaminophen  1,000 mg Oral Q6H   Or   acetaminophen (TYLENOL) oral liquid 160 mg/5 mL  1,000 mg Per Tube Q6H   acetaminophen (TYLENOL) oral liquid 160 mg/5 mL  650 mg Per  Tube Once   Or   acetaminophen  650 mg Rectal Once   aspirin EC  325 mg Oral Daily   Or   aspirin  324 mg Per Tube Daily   bisacodyl  10 mg Oral Daily   Or   bisacodyl  10 mg Rectal Daily   chlorhexidine  15 mL Mouth/Throat NOW   Chlorhexidine Gluconate Cloth  6 each Topical Daily   docusate  100 mg Per Tube BID   docusate sodium  200 mg Oral Daily   fentaNYL (SUBLIMAZE) injection  50 mcg Intravenous Once   metoprolol tartrate  12.5 mg Oral BID   Or   metoprolol tartrate  12.5 mg Per Tube BID   [START ON 12/15/2021] pantoprazole  40 mg Oral Daily   polyethylene glycol  17 g Per Tube Daily   sodium chloride flush  3 mL Intravenous Q12H    Infusions:   prismasol BGK 4/2.5 500 mL/hr at 12/16/2021 1707    prismasol BGK 4/2.5 500 mL/hr at 12/27/2021 1707   sodium chloride Stopped (12/13/2021 2122)   sodium chloride     sodium chloride     albumin human     albumin human 60 mL/hr at Jan 23, 2022 0700   amiodarone 60 mg/hr (01/23/22 0900)   calcium chloride      ceFAZolin (ANCEF) IV Stopped (01/23/22 0600)   dexmedetomidine (PRECEDEX) IV infusion 1 mcg/kg/hr (01/23/2022 0900)   epinephrine 22 mcg/min (01/23/2022 0900)   famotidine (PEPCID) IV Stopped (01/23/22 0400)   fentaNYL infusion INTRAVENOUS 350 mcg/hr (January 23, 2022 0900)   insulin 1.5 Units/hr (01/23/22 0900)   lactated ringers     lactated ringers     lactated ringers 20 mL/hr at 01/23/22 0900   magnesium sulfate     midazolam 6 mg/hr (23-Jan-2022 0900)   milrinone 0.5 mcg/kg/min (01/23/22 0900)   nitroGLYCERIN     norepinephrine (LEVOPHED) Adult infusion 40 mcg/min (01/23/2022 0900)   phenylephrine (NEO-SYNEPHRINE) Adult infusion  prismasol BGK 4/2.5 1,500 mL/hr at 12/27/2021 2052   sodium bicarbonate 25 mEq (Impella PURGE) in dextrose 5 % 1000 mL bag     sodium bicarbonate 25 mEq (Impella PURGE) in dextrose 5 % 1000 mL bag     vasopressin 0.03 Units/min (12/21/2021 0900)    PRN Medications: sodium chloride, sodium chloride, sodium  chloride, albumin human, albumin human, alteplase, dextrose, fentaNYL, heparin, lactated ringers, metoprolol tartrate, midazolam, midazolam, morphine injection, ondansetron (ZOFRAN) IV, oxyCODONE, sodium chloride, sodium chloride flush, traMADol   Assessment/Plan   1. Cardiogenic shock with severe biventricular fiaiure - Baseline EF unknown. EF 25-35% during cath on admit  - Echo 6/17 EF 10% severe LVH RV severely down  - Impella 5.5 and Impella RP in place. Flows as above - Continues on high dose inotropes - Will attempt to increase flows and wean inotropes as tolerated - SVR dropping. Start broad spectrum abx for possible sepsis component   2. Acute Type A aortic dissection  - s/p repair 6/16 with Impella 5.5 placement  - chest clean out 6/18 - sternum not closed (skin closed). Will need to return to OR once volume status improved.  - plan as above   3. Acute blood loss anemia with severe coagulopathy - had chest clean out 6/18 for tamponade. Diffuse bleeding. No culprit site - Continues to bleed. Replace blood and factors  - keep hgb > 7.5   4. Acute hypoxic respiratory failure, post-op - vent per TCTS   5. Severe HTN - BP now low with shock   6. CAD and PAD - 2022-01-07 Cath LAD and LCx stents patent. RCA 50% EF 25-35% - ASA/statin when able   7. AKI  - due to ATN - CVVHD started 6/17  CRITICAL CARE Performed by: Arvilla Meres  Total critical care time: 60 minutes  Critical care time was exclusive of separately billable procedures and treating other patients.  Critical care was necessary to treat or prevent imminent or life-threatening deterioration.  Critical care was time spent personally by me (independent of midlevel providers or residents) on the following activities: development of treatment plan with patient and/or surrogate as well as nursing, discussions with consultants, evaluation of patient's response to treatment, examination of patient, obtaining  history from patient or surrogate, ordering and performing treatments and interventions, ordering and review of laboratory studies, ordering and review of radiographic studies, pulse oximetry and re-evaluation of patient's condition.   Length of Stay: 2  Arvilla Meres, MD  12/18/2021, 9:17 AM  Advanced Heart Failure Team Pager [phone] (M-F; 7a - 5p)  Please contact [facility] for night-coverage after hours (5p -7a ) and weekends on [email or web address]

## 2021-12-30 NOTE — Progress Notes (Signed)
Notified Dr. Maren Beach of increased need for pressors.  CT output has been 0 for the past two hours.  Orders received to increase max dose of norepi to and increase vasopressin to 0.04 units.

## 2021-12-30 NOTE — Progress Notes (Signed)
Massive transfusion protocol initiated. Blood products received during my shift (7p-7a 6/17-6/18) are as follows:  Plasma (1193 ml):                Unit 1: R4431 23 [id number]               Unit 2: W2399 23 050354               Unit 3: W2399 23 761950               Unit 4: W2399 23 050346               Unit 5: D3267 23 124580  Platelets (555 ml):                Unit 1: D9833 23 825053               Unit 2: W2399 23 [id number]  Cryo (357 ml)               Unit 1: L9379 23 012445               Unit 2: K2409 23 012429               Unit 3: W2396 23 012269  PRBCs (2523 ml)               Unit 1: B3532 23 992426                Unit 2: S3419 23 622297               Unit 3: W2399 23 989211               Unit 4: W2399 23 004800               Unit 5: W2399 23 941740               Unit 6: W2399 23 [id number]               Unit 7: W2399 23 004149                Unit 8: W2399 23 856314   Total intake of blood products: 4445 ml. I&O flowsheet reflects this.

## 2021-12-30 NOTE — Hospital Course (Signed)
History of Present Illness:    At time of CT surgical consultation 41 year old male with history of previous coronary artery disease status post stents to the LAD and circumflex (as well as to his right iliac artery) presented to the ED with chest pain which started within the hour of presentation.  He described it as severe substernal squeezing pain similar to his initial MI several years ago.  He also has had some shortness of breath.  EKG showed sinus rhythm with some nonspecific EKG changes.  A code STEMI was declared.  He received a loading dose of Brilinta and he underwent emergent cardiac cath which showed patent stents to the LAD and circumflex with EF of 25%.  His creatinine was 1.8 on presentation.  He returned to the ICU and had persistent severe pain of his abdomen and back.  He had associated significant hypertension.  A CTA was performed demonstrating type  A dissection With the intimal tear originating above the aortic valve and extending into the arch as well as into the innominate artery.  The descending thoracic aorta true lumen was significantly compressed by the false lumen.  There was malperfusion of the SMA off the celiac axis as well.  The left renal artery was perfused off the false lumen. Dr. Maren Beach evaluated the patient and his studies and it was felt he should proceed to the operating room urgently for surgical repair.  Hospital course: Following diagnostic evaluation for aortic dissection he was taken urgently to the operating room and underwent  OPERATION:   1.  Emergency repair of acute type A aortic dissection. 2.  Placement of Impella 5.5 temporary left ventricular assist device. 3.  Hypothermic circulatory arrest with antegrade cerebral perfusion. The patient was returned to the surgical intensive care unit in critical condition on inotropic support as well as the Impella 5.5 LVAD.  Postoperative hospital course was notable for significant early coagulopathy.   Additionally he required multiple pressors and milrinone.  He additionally also was noted to have significant coagulopathy requiring multiple agent replacements as well as factor VII.  He is noted to have an acute renal injury and nephrology consultation was obtained early to begin on CRRT.  Additionally, the advanced heart failure team has been consulted to assist with hemodynamic management. He remained intubated and on Nitric Oxide. He developed early signs of tamponade on 06/18. Dr. Donata Clay did a bedside mediastinal exploration and removal of hematoma. The sternum was left open due to RV dysfunction. He then had lytic treatment of catheter and resumption of  CVVH with citrate for anticoagulation. His left lower extremity is becoming mottled and he is developing metabolic acidosis and bicarb deficit. He is on high doses of Epineprhine, Milrinone, and Vasopressin drips. Pulmonary/CCM had a meeting with patient's mother as patient is unresponsive, requiring accessory muscles to breathe, and is in multi system organ failure. Patient has been made a DNR.

## 2021-12-30 NOTE — Progress Notes (Signed)
CT surgery   Patient with steady decline in oxygen saturation and blood gases through the day. Good air movement on exam Repeat chest x-ray shows no pneumothorax or new pleural effusions but consistent with ARDS from multiple transfusions over the past 48 hours. LVAD and RVAD flows are satisfactory but patient is developing vasoplegia from hypoxemia and possible sepsis. I discussed the situation the patient's mother and family and they understand that the outlook is very poor and the patient is not expected to survive with severe multiorgan failure. We will continue maximal efforts with the ventilator mechanical cardiac support and CVH  but the family and I agree that in the event of cardiac arrest the patient will not be shocked or CPR provided. We will continue to provide pain medicine and continue the current level support.  Blood pressure (!) 79/60, pulse 89, temperature 98.1 F (36.7 C), resp. rate (!) 7, height 6\' 2"  (1.88 m), weight 83.9 kg, SpO2 (!) 82 %.

## 2021-12-30 NOTE — Progress Notes (Signed)
[facility] Progress Note  Subjective: 900 cc UOP yesterday, total I/O = +10 L. CRRT was started around 5 pm last night. On high dose pressors x 3 and milrinone.   Vitals:   12/23/2021 0515 01/07/2022 0530 01/09/2022 0545 12/27/2021 0600  BP:      Pulse: 89 89 86 89  Resp: 15 15 15 15   Temp: (!) 95.4 F (35.2 C) (!) 95.4 F (35.2 C) (!) 95.4 F (35.2 C) (!) 95.4 F (35.2 C)  TempSrc:      SpO2: 91% (!) 89% 92% 92%  Weight:      Height:        Exam: Gen on vent, sedated No rash, cyanosis or gangrene Sclera anicteric, throat w/ ETT No jvd or bruits Chest clear anterior/ lateral, bilat chest tubes in place RRR no RG Abd soft ntnd no mass or ascites +bs GU normal male w/ foley cath Ext mild diffuse 1-2+ edema Neuro on vent, sedated    Home meds include - aspirin, carvedilol 12.5 bid, clopidogrel     UA - not done    UNa, UCr - not done yet     CTA chest/ abd 6/16 - Renals: Right renal artery arises from the true lumen. Left renal artery arises from the false lumen. Both vessels are widely patent and there is symmetric enhancement of the kidneys.      todays lab - Na 139  K 3.7  CO2 20  BN 24  Creat 2.36  Ca 6.8 alb 2.5 CCa 8.0     Hb 8.2, wbc 5K       Assessment/ Plan: AKI - due to cardiogenic shock in setting of acute aortic dissection. Asked to see by CHF team for CRRT. CTA chest on 6/16 showed both renal arteries well perfused. CRRT started last night at 5pm. Labs today are stable w/ creat 2-2.5 range. No hyperkalemia or sig acidosis. UOP good yesterday at 900 cc. Cont CRRT.  Volume - unable to pull much fluid given hypotension. Mostly keeping even.   Bivent HF w/ cardiogenic shock - on 3 pressors + milrinone, RV and LV impella devices in place. Per CHF team Type A aortic dissection - sp repair 6/17 CAD - hx of prior LAD/ LCx stent. LHC here showed that the stents were patent     [name] 12/21/2021, 6:16 AM   Recent Labs  Lab 01/03/22 2000  Jan 03, 2022 2344 12/20/2021 0301 12/14/2021 0312  HGB  --    < > 8.2* 7.1*  ALBUMIN 2.3*  --  2.5*  --   CALCIUM 6.5*  --  6.8*  --   PHOS 3.7  --  4.1  --   CREATININE 2.21*  --  2.36*  --   K 3.5   < > 3.7 3.8   < > = values in this interval not displayed.   Inpatient medications:  sodium chloride   Intravenous Once   sodium chloride   Intravenous Once   sodium chloride   Intravenous Once   sodium chloride   Intravenous Once   acetaminophen  1,000 mg Oral Q6H   Or   acetaminophen (TYLENOL) oral liquid 160 mg/5 mL  1,000 mg Per Tube Q6H   acetaminophen (TYLENOL) oral liquid 160 mg/5 mL  650 mg Per Tube Once   Or   acetaminophen  650 mg Rectal Once   aspirin EC  325 mg Oral Daily   Or   aspirin  324 mg Per Tube Daily  bisacodyl  10 mg Oral Daily   Or   bisacodyl  10 mg Rectal Daily   chlorhexidine  15 mL Mouth/Throat NOW   Chlorhexidine Gluconate Cloth  6 each Topical Daily   docusate  100 mg Per Tube BID   docusate sodium  200 mg Oral Daily   fentaNYL (SUBLIMAZE) injection  50 mcg Intravenous Once   metoprolol tartrate  12.5 mg Oral BID   Or   metoprolol tartrate  12.5 mg Per Tube BID   [START ON 12/18/2021] pantoprazole  40 mg Oral Daily   polyethylene glycol  17 g Per Tube Daily   sodium chloride flush  3 mL Intravenous Q12H     prismasol BGK 4/2.5 500 mL/hr at 12/28/2021 1707    prismasol BGK 4/2.5 500 mL/hr at 12/15/2021 1707   sodium chloride Stopped (01/09/2022 2122)   sodium chloride     sodium chloride     albumin human     albumin human 60 mL/hr at 12/14/2021 0600   amiodarone 60 mg/hr (01/06/2022 0600)    ceFAZolin (ANCEF) IV 200 mL/hr at 12/25/2021 0600   dexmedetomidine (PRECEDEX) IV infusion 1 mcg/kg/hr (01/03/2022 0600)   epinephrine 20 mcg/min (12/27/2021 0600)   famotidine (PEPCID) IV Stopped (01/04/2022 0400)   fentaNYL infusion INTRAVENOUS 350 mcg/hr (01/04/2022 0600)   insulin 1.3 Units/hr (01/04/2022 0600)   lactated ringers     lactated ringers     lactated ringers  20 mL/hr at 12/22/2021 0600   magnesium sulfate     midazolam 6 mg/hr (12/25/2021 0600)   milrinone 0.5 mcg/kg/min (12/26/2021 0600)   nitroGLYCERIN     norepinephrine (LEVOPHED) Adult infusion 40 mcg/min (01/02/2022 0600)   phenylephrine (NEO-SYNEPHRINE) Adult infusion     prismasol BGK 4/2.5 1,500 mL/hr at 12/16/2021 2052   sodium bicarbonate 25 mEq (Impella PURGE) in dextrose 5 % 1000 mL bag     sodium bicarbonate 25 mEq (Impella PURGE) in dextrose 5 % 1000 mL bag     vasopressin 0.03 Units/min (01/04/2022 0600)   sodium chloride, sodium chloride, sodium chloride, albumin human, albumin human, alteplase, dextrose, fentaNYL, heparin, lactated ringers, metoprolol tartrate, midazolam, midazolam, morphine injection, ondansetron (ZOFRAN) IV, oxyCODONE, sodium chloride, sodium chloride flush, traMADol

## 2021-12-30 NOTE — Progress Notes (Signed)
Around 2330, pt began developing hemodynamic changes consistent with cardiac tamponade (significant narrowing of pulse pressure, decreased flows on Impella CP, decreased BP, significantly decrease in CT output, etc). Stat CXR obtained and Dr. Donata Clay notified--decision was made to perform a resternotomy at bedside.   Pt did wake up for first time during shift shortly before procedure began--Precedex increased to 1 mcg/hr, fentanyl increased to 400 mcg/hr, versed increased to 6 mg/hr, and 100 mcg of rocuronium given before incision was made. CRRT was also stopped so blood could be returned during procedure.   Pt tolerated procedure well. After clot removal, improvement in hemodynamics noted--able to decreased pressor support significantly & increased flows on Impella CP noted. Care ongoing.

## 2021-12-30 NOTE — Progress Notes (Signed)
1 Day Post-Op Procedure(s) (LRB): VENTRICULAR ASSIST DEVICE INSERTION (N/A) TEMPORARY DIALYSIS CATHETER (Left) Subjective: Patient more stable today after evacuation of mediastinal hematoma early a.m.  Flows of both Impella 5.5 and RP improved. Chest x-ray following procedure is improved and ventilator settings are being reduced Chest tube output is declining after optimizing coagulation panel, hold on IV heparin today for Impella circuits and CRRT circuit Patient's hemodynamics much more stable with a pacing to avoid left bundle branch block with severe LVH Objective: Vital signs in last 24 hours: Temp:  [88.3 F (31.3 C)-97 F (36.1 C)] 96.1 F (35.6 C) (06/18 1134) Pulse Rate:  [0-179] 89 (06/18 1134) Cardiac Rhythm: A-V Sequential paced (06/17 2000) Resp:  [6-22] 12 (06/18 1134) BP: (48-109)/(23-91) 85/71 (06/18 1105) SpO2:  [19 %-100 %] 96 % (06/18 1134) Arterial Line BP: (54-163)/(44-85) 70/64 (06/18 1130) FiO2 (%):  [60 %-100 %] 60 % (06/18 1204)  Hemodynamic parameters for last 24 hours: PAP: (20-36)/(14-29) 28/22 CVP:  [19 mmHg-80 mmHg] 80 mmHg CO:  [1.7 L/min-3.2 L/min] 2.7 L/min CI:  [0.8 L/min/m2-1.6 L/min/m2] 1.4 L/min/m2  Intake/Output from previous day: 06/17 0701 - 06/18 0700 In: 17243.1 [I.V.:4930.8; Blood:4586.2; IV Piggyback:2938.5] Out: 18841 [Urine:65; Emesis/NG output:650; YSAYT:0160; Chest Tube:6190] Intake/Output this shift: Total I/O In: 1761.9 [I.V.:1268.9; Blood:290; IV Piggyback:202.9] Out: 273 [Urine:5; Chest Tube:340]  Exam Sedated on ventilator Breath sounds clear No cardiac murmur Abdomen remains moderately distended but not tense with reduced bowel sounds Lower extremities without palpable pulses but not cool  Lab Results: Recent Labs    01/11/2022 0721 12/16/2021 0906 01/07/2022 0908  WBC 5.8 6.0  --   HGB 8.7* 8.4* 6.8*  HCT 24.1* 23.4* 20.0*  PLT 78* 78*  --    BMET:  Recent Labs    12/20/2021 0301 12/24/2021 0312 12/16/2021 0721  12/14/2021 0908  NA 139   < > 137 140  K 3.7   < > 3.8 4.1  CL 106  --  106  --   CO2 20*  --  20*  --   GLUCOSE 133*  --  135*  --   BUN 24*  --  22*  --   CREATININE 2.36*  --  2.32*  --   CALCIUM 6.8*  --  6.2*  --    < > = values in this interval not displayed.    PT/INR:  Recent Labs    01/05/2022 0721  LABPROT 16.2*  INR 1.3*   ABG    Component Value Date/Time   PHART 7.516 (H) 01/01/2022 0908   HCO3 23.3 12/14/2021 0908   TCO2 24 01/11/2022 0908   ACIDBASEDEF 1.0 12/26/2021 0531   O2SAT 100 12/16/2021 0908   CBG (last 3)  Recent Labs    12/23/2021 0905 01/02/2022 1001 12/19/2021 1059  GLUCAP 144* 138* 157*    Assessment/Plan: S/P Procedure(s) (LRB): VENTRICULAR ASSIST DEVICE INSERTION (N/A) TEMPORARY DIALYSIS CATHETER (Left) Patient will need a few days of biventricular mechanical support and  CRT before considering sternal closure. Hold low-dose heparin today Cover with broad-spectrum antibiotics Follow GI function with preoperative abdominal pain and CT showing dilated loops of bowel and SMA occlusion by the dissection Patient's condition and plan of care discussed with patient's mother in ICU today.  LOS: 2 days    Lovett Sox 12/29/2021

## 2021-12-30 NOTE — Progress Notes (Signed)
  Patient deteriorating throughout the afternoon. Now on max dose pressors despite biventricular impella support.   Bleeding from CTs has slowed.   I did bedside echo. LVEF ~10%  RV severely HK. Small anterior effusion.   Impella flows are good.   I called his mother to update her on how grave his situation was and that he only has small chance of survival. We discussed limiting resuscitative efforts in case of cardiac arrest and she said that she does not want to give up and wants all efforts possible.Including shocking and cardiac massage.  Additional CCT 60 mins.   Arvilla Meres, MD  3:54 PM

## 2021-12-31 ENCOUNTER — Encounter (HOSPITAL_COMMUNITY): Payer: Self-pay | Admitting: Interventional Cardiology

## 2021-12-31 ENCOUNTER — Inpatient Hospital Stay (HOSPITAL_COMMUNITY): Payer: BC Managed Care – PPO

## 2021-12-31 DIAGNOSIS — R570 Cardiogenic shock: Secondary | ICD-10-CM

## 2021-12-31 DIAGNOSIS — I249 Acute ischemic heart disease, unspecified: Secondary | ICD-10-CM | POA: Diagnosis not present

## 2021-12-31 DIAGNOSIS — I7101 Dissection of ascending aorta: Secondary | ICD-10-CM | POA: Diagnosis not present

## 2021-12-31 DIAGNOSIS — I5082 Biventricular heart failure: Secondary | ICD-10-CM | POA: Diagnosis not present

## 2021-12-31 LAB — PREPARE CRYOPRECIPITATE
Unit division: 0
Unit division: 0
Unit division: 0

## 2021-12-31 LAB — POCT I-STAT 7, (LYTES, BLD GAS, ICA,H+H)
Acid-base deficit: 10 mmol/L — ABNORMAL HIGH (ref 0.0–2.0)
Acid-base deficit: 8 mmol/L — ABNORMAL HIGH (ref 0.0–2.0)
Acid-base deficit: 6 mmol/L — ABNORMAL HIGH (ref 0.0–2.0)
Bicarbonate: 16.7 mmol/L — ABNORMAL LOW (ref 20.0–28.0)
Bicarbonate: 17.4 mmol/L — ABNORMAL LOW (ref 20.0–28.0)
Bicarbonate: 19 mmol/L — ABNORMAL LOW (ref 20.0–28.0)
Calcium, Ion: 1.1 mmol/L — ABNORMAL LOW (ref 1.15–1.40)
Calcium, Ion: 1.03 mmol/L — ABNORMAL LOW (ref 1.15–1.40)
Calcium, Ion: 0.98 mmol/L — ABNORMAL LOW (ref 1.15–1.40)
HCT: 15 % — ABNORMAL LOW (ref 39.0–52.0)
HCT: 15 % — ABNORMAL LOW (ref 39.0–52.0)
HCT: 20 % — ABNORMAL LOW (ref 39.0–52.0)
Hemoglobin: 5.1 g/dL — CL (ref 13.0–17.0)
Hemoglobin: 5.1 g/dL — CL (ref 13.0–17.0)
Hemoglobin: 6.8 g/dL — CL (ref 13.0–17.0)
O2 Saturation: 90 %
O2 Saturation: 80 %
O2 Saturation: 78 %
Patient temperature: 36.3
Patient temperature: 35.7
Patient temperature: 35.8
Potassium: 5.7 mmol/L — ABNORMAL HIGH (ref 3.5–5.1)
Potassium: 4.9 mmol/L (ref 3.5–5.1)
Potassium: 4.7 mmol/L (ref 3.5–5.1)
Sodium: 138 mmol/L (ref 135–145)
Sodium: 139 mmol/L (ref 135–145)
Sodium: 139 mmol/L (ref 135–145)
TCO2: 18 mmol/L — ABNORMAL LOW (ref 22–32)
TCO2: 18 mmol/L — ABNORMAL LOW (ref 22–32)
TCO2: 20 mmol/L — ABNORMAL LOW (ref 22–32)
pCO2 arterial: 38.7 mmHg (ref 32–48)
pCO2 arterial: 32.6 mmHg (ref 32–48)
pCO2 arterial: 33.3 mmHg (ref 32–48)
pH, Arterial: 7.238 — ABNORMAL LOW (ref 7.35–7.45)
pH, Arterial: 7.33 — ABNORMAL LOW (ref 7.35–7.45)
pH, Arterial: 7.359 (ref 7.35–7.45)
pO2, Arterial: 66 mmHg — ABNORMAL LOW (ref 83–108)
pO2, Arterial: 44 mmHg — ABNORMAL LOW (ref 83–108)
pO2, Arterial: 41 mmHg — ABNORMAL LOW (ref 83–108)

## 2021-12-31 LAB — COMPREHENSIVE METABOLIC PANEL
ALT: 85 U/L — ABNORMAL HIGH (ref 0–44)
AST: 1417 U/L — ABNORMAL HIGH (ref 15–41)
Albumin: 2.2 g/dL — ABNORMAL LOW (ref 3.5–5.0)
Alkaline Phosphatase: 33 U/L — ABNORMAL LOW (ref 38–126)
Anion gap: 14 (ref 5–15)
BUN: 24 mg/dL — ABNORMAL HIGH (ref 6–20)
CO2: 15 mmol/L — ABNORMAL LOW (ref 22–32)
Calcium: 6.9 mg/dL — ABNORMAL LOW (ref 8.9–10.3)
Chloride: 108 mmol/L (ref 98–111)
Creatinine, Ser: 2.8 mg/dL — ABNORMAL HIGH (ref 0.61–1.24)
GFR, Estimated: 28 mL/min — ABNORMAL LOW (ref >60)
Glucose, Bld: 107 mg/dL — ABNORMAL HIGH (ref 70–99)
Potassium: 5.7 mmol/L — ABNORMAL HIGH (ref 3.5–5.1)
Sodium: 137 mmol/L (ref 135–145)
Total Bilirubin: 1.9 mg/dL — ABNORMAL HIGH (ref 0.3–1.2)
Total Protein: 3.4 g/dL — ABNORMAL LOW (ref 6.5–8.1)

## 2021-12-31 LAB — FIBRINOGEN: Fibrinogen: 337 mg/dL (ref 210–475)

## 2021-12-31 LAB — PREPARE RBC (CROSSMATCH)

## 2021-12-31 LAB — CBC
HCT: 18 % — ABNORMAL LOW (ref 39.0–52.0)
Hemoglobin: 6.3 g/dL — CL (ref 13.0–17.0)
MCH: 30.3 pg (ref 26.0–34.0)
MCHC: 35 g/dL (ref 30.0–36.0)
MCV: 86.5 fL (ref 80.0–100.0)
Platelets: 45 10*3/uL — ABNORMAL LOW (ref 150–400)
RBC: 2.08 MIL/uL — ABNORMAL LOW (ref 4.22–5.81)
RDW: 17.2 % — ABNORMAL HIGH (ref 11.5–15.5)
WBC: 8.7 10*3/uL (ref 4.0–10.5)
nRBC: 9.8 % — ABNORMAL HIGH (ref 0.0–0.2)

## 2021-12-31 LAB — BPAM PLATELET PHERESIS
Blood Product Expiration Date: 202306192359
Blood Product Expiration Date: 202306192359
Blood Product Expiration Date: 202306202359
ISSUE DATE / TIME: 202306171753
ISSUE DATE / TIME: 202306171753
ISSUE DATE / TIME: 202306180012
Unit Type and Rh: 6200
Unit Type and Rh: 6200
Unit Type and Rh: 6200

## 2021-12-31 LAB — PREPARE PLATELET PHERESIS
Unit division: 0
Unit division: 0
Unit division: 0

## 2021-12-31 LAB — GLUCOSE, CAPILLARY
Glucose-Capillary: 103 mg/dL — ABNORMAL HIGH (ref 70–99)
Glucose-Capillary: 108 mg/dL — ABNORMAL HIGH (ref 70–99)
Glucose-Capillary: 112 mg/dL — ABNORMAL HIGH (ref 70–99)
Glucose-Capillary: 126 mg/dL — ABNORMAL HIGH (ref 70–99)
Glucose-Capillary: 129 mg/dL — ABNORMAL HIGH (ref 70–99)
Glucose-Capillary: 132 mg/dL — ABNORMAL HIGH (ref 70–99)
Glucose-Capillary: 95 mg/dL (ref 70–99)
Glucose-Capillary: 96 mg/dL (ref 70–99)
Glucose-Capillary: 96 mg/dL (ref 70–99)
Glucose-Capillary: 97 mg/dL (ref 70–99)
Glucose-Capillary: 99 mg/dL (ref 70–99)

## 2021-12-31 LAB — BPAM CRYOPRECIPITATE
Blood Product Expiration Date: 202306180344
Blood Product Expiration Date: 202306180344
Blood Product Expiration Date: 202306180633
ISSUE DATE / TIME: 202306172149
ISSUE DATE / TIME: 202306172149
ISSUE DATE / TIME: 202306180106
Unit Type and Rh: 5100
Unit Type and Rh: 6200
Unit Type and Rh: 6200

## 2021-12-31 LAB — LACTATE DEHYDROGENASE: LDH: 1742 U/L — ABNORMAL HIGH (ref 98–192)

## 2021-12-31 LAB — COOXEMETRY PANEL
Carboxyhemoglobin: 1.5 % (ref 0.5–1.5)
Methemoglobin: 1.6 % — ABNORMAL HIGH (ref 0.0–1.5)
O2 Saturation: 56.7 %
Total hemoglobin: 7.4 g/dL — ABNORMAL LOW (ref 12.0–16.0)

## 2021-12-31 LAB — PROTIME-INR
INR: 2.7 — ABNORMAL HIGH (ref 0.8–1.2)
Prothrombin Time: 28.4 seconds — ABNORMAL HIGH (ref 11.4–15.2)

## 2021-12-31 LAB — PHOSPHORUS: Phosphorus: 6.1 mg/dL — ABNORMAL HIGH (ref 2.5–4.6)

## 2021-12-31 LAB — LACTIC ACID, PLASMA: Lactic Acid, Venous: 8.6 mmol/L (ref 0.5–1.9)

## 2021-12-31 LAB — MAGNESIUM: Magnesium: 2.2 mg/dL (ref 1.7–2.4)

## 2021-12-31 MED ORDER — ALTEPLASE 2 MG IJ SOLR
2.0000 mg | Freq: Once | INTRAMUSCULAR | Status: AC
Start: 2021-12-31 — End: 2021-12-31
  Administered 2021-12-31: 2 mg
  Filled 2021-12-31: qty 2

## 2021-12-31 MED ORDER — GLYCOPYRROLATE 0.2 MG/ML IJ SOLN: 0.2000 mg | INTRAMUSCULAR | Status: DC | PRN

## 2021-12-31 MED ORDER — HALOPERIDOL 0.5 MG PO TABS: 0.5000 mg | ORAL_TABLET | ORAL | Status: DC | PRN

## 2021-12-31 MED ORDER — ACETAMINOPHEN 160 MG/5ML PO SOLN
650.0000 mg | Freq: Once | ORAL | Status: AC
Start: 2021-12-31 — End: 2021-12-31
  Administered 2021-12-31: 650 mg via ORAL

## 2021-12-31 MED ORDER — INSULIN ASPART 100 UNIT/ML IJ SOLN
10.0000 [IU] | Freq: Once | INTRAMUSCULAR | Status: AC
  Administered 2021-12-31: 10 [IU] via INTRAVENOUS

## 2021-12-31 MED ORDER — ALTEPLASE 2 MG IJ SOLR
6.0000 mg | Freq: Once | INTRAMUSCULAR | Status: AC
Start: 2021-12-31 — End: 2021-12-31
  Administered 2021-12-31: 6 mg
  Filled 2021-12-31: qty 6

## 2021-12-31 MED ORDER — BIOTENE DRY MOUTH MT LIQD: 15.0000 mL | OROMUCOSAL | Status: DC | PRN

## 2021-12-31 MED ORDER — SODIUM CHLORIDE 0.9 % IV SOLN
0.5000 ug/min | INTRAVENOUS | Status: DC
  Administered 2021-12-31: 30 ug/min via INTRAVENOUS
  Administered 2021-12-31: 26 ug/min via INTRAVENOUS
  Filled 2021-12-31 (×3): qty 10

## 2021-12-31 MED ORDER — 0.9 % SODIUM CHLORIDE (POUR BTL) OPTIME
TOPICAL | Status: DC | PRN
  Administered 2021-12-30: 1000 mL

## 2021-12-31 MED ORDER — SODIUM BICARBONATE 8.4 % IV SOLN
100.0000 meq | Freq: Once | INTRAVENOUS | Status: AC
  Administered 2021-12-31: 100 meq via INTRAVENOUS
  Filled 2021-12-31: qty 100

## 2021-12-31 MED ORDER — ONDANSETRON 4 MG PO TBDP: 4.0000 mg | ORAL_TABLET | Freq: Four times a day (QID) | ORAL | Status: DC | PRN

## 2021-12-31 MED ORDER — SODIUM ZIRCONIUM CYCLOSILICATE 10 G PO PACK
10.0000 g | PACK | Freq: Once | ORAL | Status: AC
  Administered 2021-12-31: 10 g
  Filled 2021-12-31: qty 1

## 2021-12-31 MED ORDER — HALOPERIDOL LACTATE 5 MG/ML IJ SOLN: 0.5000 mg | INTRAMUSCULAR | Status: DC | PRN

## 2021-12-31 MED ORDER — GLYCOPYRROLATE 1 MG PO TABS: 1.0000 mg | ORAL_TABLET | ORAL | Status: DC | PRN

## 2021-12-31 MED ORDER — ACETAMINOPHEN 325 MG PO TABS: 650.0000 mg | ORAL_TABLET | Freq: Four times a day (QID) | ORAL | Status: DC | PRN

## 2021-12-31 MED ORDER — SODIUM CHLORIDE 0.9 % IV SOLN
1.0000 g | Freq: Two times a day (BID) | INTRAVENOUS | Status: DC
  Filled 2021-12-31: qty 20

## 2021-12-31 MED ORDER — PANTOPRAZOLE SODIUM 40 MG IV SOLR
40.0000 mg | INTRAVENOUS | Status: DC
  Administered 2021-12-31: 40 mg via INTRAVENOUS
  Filled 2021-12-31: qty 10

## 2021-12-31 MED ORDER — HALOPERIDOL LACTATE 2 MG/ML PO CONC: 0.5000 mg | ORAL | Status: DC | PRN

## 2021-12-31 MED ORDER — ACETAMINOPHEN 650 MG RE SUPP: 650.0000 mg | Freq: Four times a day (QID) | RECTAL | Status: DC | PRN

## 2021-12-31 MED ORDER — POLYVINYL ALCOHOL 1.4 % OP SOLN: 1.0000 [drp] | Freq: Four times a day (QID) | OPHTHALMIC | Status: DC | PRN

## 2021-12-31 MED ORDER — HEMOSTATIC AGENTS (NO CHARGE) OPTIME
TOPICAL | Status: DC | PRN
  Administered 2021-12-30 (×2): 1 via TOPICAL

## 2021-12-31 MED ORDER — SODIUM BICARBONATE 8.4 % IV SOLN
100.0000 meq | Freq: Once | INTRAVENOUS | Status: AC
Start: 2021-12-31 — End: 2021-12-31
  Administered 2021-12-31: 100 meq via INTRAVENOUS
  Filled 2021-12-31: qty 100

## 2021-12-31 MED ORDER — SODIUM BICARBONATE 8.4 % IV SOLN
50.0000 meq | Freq: Once | INTRAVENOUS | Status: DC
  Filled 2021-12-31: qty 50

## 2021-12-31 MED ORDER — ONDANSETRON HCL 4 MG/2ML IJ SOLN: 4.0000 mg | Freq: Four times a day (QID) | INTRAMUSCULAR | Status: DC | PRN

## 2021-12-31 MED ORDER — SODIUM BICARBONATE 8.4 % IV SOLN
50.0000 meq | Freq: Once | INTRAVENOUS | Status: AC
Start: 2021-12-31 — End: 2021-12-31
  Administered 2021-12-31: 50 meq via INTRAVENOUS

## 2021-12-31 MED ORDER — DEXTROSE 50 % IV SOLN
1.0000 | Freq: Once | INTRAVENOUS | Status: AC
Start: 2021-12-31 — End: 2021-12-31
  Administered 2021-12-31: 50 mL via INTRAVENOUS

## 2021-12-31 MED FILL — Heparin Sodium (Porcine) Inj 1000 Unit/ML: INTRAMUSCULAR | Qty: 10 | Status: AC

## 2022-01-01 LAB — BPAM FFP
Blood Product Expiration Date: 202306222359
Blood Product Expiration Date: 202306222359
Blood Product Expiration Date: 202306222359
Blood Product Expiration Date: 202306222359
Blood Product Expiration Date: 202306222359
Blood Product Expiration Date: 202306222359
Blood Product Expiration Date: 202306222359
Blood Product Expiration Date: 202306222359
Blood Product Expiration Date: 202306222359
Blood Product Expiration Date: 202306232359
Blood Product Expiration Date: 202307042359
Blood Product Expiration Date: 202307042359
Blood Product Expiration Date: 202307042359
Blood Product Expiration Date: 202307062359
Blood Product Expiration Date: 202307062359
Blood Product Expiration Date: 202307062359
Blood Product Expiration Date: 202307072359
Blood Product Expiration Date: 202307072359
ISSUE DATE / TIME: 202306172018
ISSUE DATE / TIME: 202306172018
ISSUE DATE / TIME: 202306172346
ISSUE DATE / TIME: 202306172346
ISSUE DATE / TIME: 202306180011
ISSUE DATE / TIME: 202306180011
ISSUE DATE / TIME: 202306180011
ISSUE DATE / TIME: 202306180011
ISSUE DATE / TIME: 202306180011
ISSUE DATE / TIME: 202306180011
ISSUE DATE / TIME: 202306180011
ISSUE DATE / TIME: 202306180011
ISSUE DATE / TIME: 202306180043
ISSUE DATE / TIME: 202306180043
ISSUE DATE / TIME: 202306180043
ISSUE DATE / TIME: 202306190634
ISSUE DATE / TIME: 202306190634
Unit Type and Rh: 6200
Unit Type and Rh: 6200
Unit Type and Rh: 6200
Unit Type and Rh: 6200
Unit Type and Rh: 6200
Unit Type and Rh: 6200
Unit Type and Rh: 6200
Unit Type and Rh: 6200
Unit Type and Rh: 7300
Unit Type and Rh: 7300
Unit Type and Rh: 7300
Unit Type and Rh: 7300
Unit Type and Rh: 7300
Unit Type and Rh: 7300
Unit Type and Rh: 7300
Unit Type and Rh: 7300
Unit Type and Rh: 7300
Unit Type and Rh: 7300

## 2022-01-01 LAB — PREPARE FRESH FROZEN PLASMA
Unit division: 0
Unit division: 0
Unit division: 0
Unit division: 0
Unit division: 0
Unit division: 0
Unit division: 0
Unit division: 0
Unit division: 0
Unit division: 0
Unit division: 0
Unit division: 0
Unit division: 0
Unit division: 0

## 2022-01-01 LAB — PATHOLOGIST SMEAR REVIEW

## 2022-01-01 NOTE — Anesthesia Postprocedure Evaluation (Signed)
Anesthesia Post Note  Patient: [name]  Procedure(s) Performed: REPAIR TYPE A DISSECTION PLACEMENT OF IMPELLA LEFT VENTRICULAR ASSIST DEVICE (Chest)     Patient location during evaluation: SICU Anesthesia Type: General Level of consciousness: sedated Pain management: pain level controlled Vital Signs Assessment: post-procedure vital signs reviewed and stable Respiratory status: patient remains intubated per anesthesia plan Cardiovascular status: stable Postop Assessment: no apparent nausea or vomiting Anesthetic complications: no         Reeanna Acri

## 2022-01-02 LAB — BPAM RBC
Blood Product Expiration Date: 202306232359
Blood Product Expiration Date: 202306232359
Blood Product Expiration Date: 202307062359
Blood Product Expiration Date: 202307062359
Blood Product Expiration Date: 202307062359
Blood Product Expiration Date: 202307062359
Blood Product Expiration Date: 202307062359
Blood Product Expiration Date: 202307062359
Blood Product Expiration Date: 202307092359
Blood Product Expiration Date: 202307092359
Blood Product Expiration Date: 202307122359
Blood Product Expiration Date: 202307122359
Blood Product Expiration Date: 202307122359
Blood Product Expiration Date: 202307132359
Blood Product Expiration Date: 202307132359
Blood Product Expiration Date: 202307152359
Blood Product Expiration Date: 202307152359
Blood Product Expiration Date: 202307152359
Blood Product Expiration Date: 202307152359
Blood Product Expiration Date: 202307152359
Blood Product Expiration Date: 202307152359
Blood Product Expiration Date: 202307152359
Blood Product Expiration Date: 202307152359
Blood Product Expiration Date: 202307152359
Blood Product Expiration Date: 202307162359
Blood Product Expiration Date: 202307162359
Blood Product Expiration Date: 202307162359
Blood Product Expiration Date: 202307162359
Blood Product Expiration Date: 202307162359
Blood Product Expiration Date: 202307162359
ISSUE DATE / TIME: 202306162343
ISSUE DATE / TIME: 202306162343
ISSUE DATE / TIME: 202306162343
ISSUE DATE / TIME: 202306162343
ISSUE DATE / TIME: 202306170241
ISSUE DATE / TIME: 202306170241
ISSUE DATE / TIME: 202306170610
ISSUE DATE / TIME: 202306170610
ISSUE DATE / TIME: 202306171318
ISSUE DATE / TIME: 202306171318
ISSUE DATE / TIME: 202306171318
ISSUE DATE / TIME: 202306171318
ISSUE DATE / TIME: 202306172019
ISSUE DATE / TIME: 202306172019
ISSUE DATE / TIME: 202306172019
ISSUE DATE / TIME: 202306172019
ISSUE DATE / TIME: 202306172125
ISSUE DATE / TIME: 202306172125
ISSUE DATE / TIME: 202306172342
ISSUE DATE / TIME: 202306172344
ISSUE DATE / TIME: 202306172344
ISSUE DATE / TIME: 202306172344
ISSUE DATE / TIME: 202306172344
ISSUE DATE / TIME: 202306180037
ISSUE DATE / TIME: 202306180037
ISSUE DATE / TIME: 202306180928
ISSUE DATE / TIME: 202306190540
ISSUE DATE / TIME: 202306190632
ISSUE DATE / TIME: 202306190632
ISSUE DATE / TIME: 202306210236
Unit Type and Rh: 1700
Unit Type and Rh: 1700
Unit Type and Rh: 7300
Unit Type and Rh: 7300
Unit Type and Rh: 7300
Unit Type and Rh: 7300
Unit Type and Rh: 7300
Unit Type and Rh: 7300
Unit Type and Rh: 7300
Unit Type and Rh: 7300
Unit Type and Rh: 7300
Unit Type and Rh: 7300
Unit Type and Rh: 7300
Unit Type and Rh: 7300
Unit Type and Rh: 7300
Unit Type and Rh: 7300
Unit Type and Rh: 7300
Unit Type and Rh: 7300
Unit Type and Rh: 7300
Unit Type and Rh: 7300
Unit Type and Rh: 7300
Unit Type and Rh: 7300
Unit Type and Rh: 7300
Unit Type and Rh: 7300
Unit Type and Rh: 7300
Unit Type and Rh: 7300
Unit Type and Rh: 7300
Unit Type and Rh: 7300
Unit Type and Rh: 7300
Unit Type and Rh: 7300

## 2022-01-02 LAB — TYPE AND SCREEN
ABO/RH(D): B POS
Antibody Screen: NEGATIVE
Unit division: 0
Unit division: 0
Unit division: 0
Unit division: 0
Unit division: 0
Unit division: 0
Unit division: 0
Unit division: 0
Unit division: 0
Unit division: 0
Unit division: 0
Unit division: 0
Unit division: 0
Unit division: 0
Unit division: 0
Unit division: 0
Unit division: 0
Unit division: 0
Unit division: 0
Unit division: 0
Unit division: 0
Unit division: 0
Unit division: 0
Unit division: 0
Unit division: 0
Unit division: 0
Unit division: 0
Unit division: 0
Unit division: 0
Unit division: 0

## 2022-01-02 LAB — SURGICAL PATHOLOGY

## 2022-01-02 MED FILL — Sodium Bicarbonate IV Soln 8.4%: INTRAVENOUS | Qty: 200 | Status: AC

## 2022-01-02 MED FILL — Lidocaine HCl Local Preservative Free (PF) Inj 2%: INTRAMUSCULAR | Qty: 15 | Status: AC

## 2022-01-02 MED FILL — Heparin Sodium (Porcine) Inj 1000 Unit/ML: INTRAMUSCULAR | Qty: 30 | Status: AC

## 2022-01-02 MED FILL — Mannitol IV Soln 20%: INTRAVENOUS | Qty: 500 | Status: AC

## 2022-01-02 MED FILL — Sodium Chloride IV Soln 0.9%: INTRAVENOUS | Qty: 5000 | Status: AC

## 2022-01-02 MED FILL — Calcium Chloride Inj 10%: INTRAVENOUS | Qty: 20 | Status: AC

## 2022-01-02 MED FILL — Vancomycin HCl For IV Soln 10 GM (Base Equivalent): INTRAVENOUS | Qty: 1250 | Status: AC

## 2022-01-02 MED FILL — Potassium Chloride Inj 2 mEq/ML: INTRAVENOUS | Qty: 40 | Status: AC

## 2022-01-02 MED FILL — Heparin Sodium (Porcine) Inj 1000 Unit/ML: Qty: 1000 | Status: AC

## 2022-01-02 MED FILL — Albumin, Human Inj 5%: INTRAVENOUS | Qty: 250 | Status: AC

## 2022-01-02 MED FILL — Electrolyte-R (PH 7.4) Solution: INTRAVENOUS | Qty: 9000 | Status: AC

## 2022-01-12 NOTE — Progress Notes (Signed)
NAMERefugio Mcguire, MRN:  [id number], DOB:  24-Jul-1980, LOS: 3 ADMISSION DATE:  12/27/2021, CONSULTATION DATE:  01/28/22 REFERRING MD:  Bensimhon, CHIEF COMPLAINT:  chest pain   History of Present Illness:  41 year old man w/ hx HTN/CAD/PAD p/w chest pain.  Cath neg.  CTA showing Type A dissection.  Underwent repair and impella 5.5 placement.  Postop course complicated by mediastinal hematoma requiring bedside redo sternotomy with partial closure.  Required good deal of blood products and now having issues with oxygenation on vent for which PCCM is consulted.  Pertinent  Medical History  CAD HTN PVD Tobacco abuse  Significant Hospital Events: Including procedures, antibiotic start and stop dates in addition to other pertinent events   6/16 LHC 6/16-6/17 type A dissection repair, impella placement 6/18 bedside redo sternotomy with hematoma evacuation  Interim History / Subjective:  Patient went into multiorgan system failure Despite maximal ventilatory support with FiO2 100% and PEEP of 16 and on inhaled nitric oxide patient O2 sat remained in 70s He is on multiple vasopressors  Objective   Blood pressure (!) 92/58, pulse 89, temperature (!) 96.3 F (35.7 C), temperature source Core, resp. rate (!) 28, height 6\' 2"  (1.88 m), weight 83.9 kg, SpO2 (!) 74 %. PAP: (21-62)/(12-41) 53/32 CVP:  [6 mmHg-28 mmHg] 23 mmHg CO:  [2.7 L/min-6.2 L/min] 6.2 L/min CI:  [1.4 L/min/m2-3 L/min/m2] 3 L/min/m2  Vent Mode: PRVC FiO2 (%):  [60 %-100 %] 100 % Set Rate:  [12 bmp-28 bmp] 28 bmp Vt Set:  [650 mL-820 mL] 650 mL PEEP:  [5 cmH20-16 cmH20] 16 cmH20 Pressure Support:  [10 cmH20] 10 cmH20 Plateau Pressure:  [28 cmH20-36 cmH20] 36 cmH20   Intake/Output Summary (Last 24 hours) at 01/03/2022 01/02/2022 Last data filed at 01/09/2022 0800 Gross per 24 hour  Intake 8033.87 ml  Output 1799 ml  Net 6234.87 ml   Filed Weights   12/18/2021 1700  Weight: 83.9 kg    Examination:   Physical  exam: General: Crtitically ill-appearing male, orally intubated HEENT: [place]/AT, eyes anicteric.  ETT and OGT in place Neuro: Sedated, not following commands.  Eyes are closed.  Pupils 3 mm bilateral reactive to light Chest: Coarse breath sounds, no wheezes or rhonchi Heart: Regular rate and rhythm, no murmurs or gallops Abdomen: Soft, nontender, nondistended, bowel sounds present Skin: No rash Anasarca  Resolved Hospital Problem list     Assessment & Plan:  Acute hypoxemic respiratory failure due to some combination fluid overload, TRALI, severe ARDS Type A aortic dissection post repair, complicated with mediastinal hematoma s/p bedside redo sternotomy and partial closure Cardiogenic shock Post op severe biventricular failure on LVAD/RVAD and Impella CAD Acute renal failure on CRRT Lactic acidosis Acute blood loss anemia s/p multiple transfusions Shock liver  Patient remained hypoxic with O2 sat in 70s despite being on 100% FiO2 and PEEP of 16 Inhaled nitric oxide was added overnight without much improvement Unfortunately not tolerating CRRT due to profound shock and now his HD catheter is clogged  All inotrope/impella/rescuscitation efforts per CHF and TCTS Discussed with CHF team and nephrology Trend lactate Monitor LFTs Monitor H&H and transfuse if less than 7   Continue goals of care discussion due to grave prognosis  Best Practice (right click and "Reselect all SmartList Selections" daily)  Per primary  Labs   CBC: Recent Labs  Lab 01/03/2022 1645 12/14/2021 1716 01/28/2022 0301 2022/01/28 01/01/22 28-Jan-2022 0721 01/28/22 0906 Jan 28, 2022 0908 01-28-22 1851 Jan 28, 2022 2042 2022-01-28 2046 01/11/2022 0500  12/14/2021 0512  WBC 12.9*   < > 5.1  --  5.8 6.0  --   --   --  9.8 8.7  --   NEUTROABS 7.2  --   --   --   --   --   --   --   --   --   --   --   HGB 11.9*   < > 8.2*   < > 8.7* 8.4*   < > 7.1* 7.1* 8.2* 6.3* 5.1*  HCT 36.1*   < > 22.9*   < > 24.1* 23.4*   < > 21.0* 21.0*  23.9* 18.0* 15.0*  MCV 90.3   < > 83.9  --  81.7 81.8  --   --   --  85.4 86.5  --   PLT 231   < > 52*  --  78* 78*  --   --   --  64* 45*  --    < > = values in this interval not displayed.    Basic Metabolic Panel: Recent Labs  Lab 12/25/2021 0301 01/10/2022 0312 01/03/2022 0721 12/25/2021 0908 12/23/2021 1047 12/17/2021 1318 01/07/2022 1500 01/01/2022 1632 01/07/2022 1851 01/03/2022 2042 01/05/2022 2046 12/15/2021 0500 01/07/2022 0512  NA 139   < > 137   < > 139   < > 137   < > 139 138 135 137 138  K 3.7   < > 3.8   < > 4.1   < > 4.5   < > 4.8 4.9 4.8 5.7* 5.7*  CL 106  --  106  --  110  --  107  --   --   --  105 108  --   CO2 20*  --  20*  --  20*  --  21*  --   --   --  23 15*  --   GLUCOSE 133*  --  135*  --  136*  --  127*  --   --   --  113* 107*  --   BUN 24*  --  22*  --  21*  --  21*  --   --   --  21* 24*  --   CREATININE 2.36*  --  2.32*  --  2.31*  --  2.40*  --   --   --  2.49* 2.80*  --   CALCIUM 6.8*  --  6.2*  --  6.7*  --  6.4*  --   --   --  6.6* 6.9*  --   MG 2.1  --  2.0  --  2.0  --   --   --   --   --  2.1 2.2  --   PHOS 4.1  --  4.0  --   --   --  4.4  --   --   --  4.5 6.1*  --    < > = values in this interval not displayed.   GFR: Estimated Creatinine Clearance: 40 mL/min (A) (by C-G formula based on SCr of 2.8 mg/dL (H)). Recent Labs  Lab 01/06/2022 2000 01/03/2022 0301 01/05/2022 0721 12/27/2021 0906 12/16/2021 1500 01/04/2022 2046 12/13/2021 0500  WBC  --  5.1 5.8 6.0  --  9.8 8.7  LATICACIDVEN 4.7* 3.0* 2.6*  --  2.7*  --   --     Liver Function Tests: Recent Labs  Lab Jan 26, 2022 1645 12/22/2021 2000 01/03/2022 0721 01/05/2022 1047 01/11/2022 1500 12/19/2021  2046 01/07/2022 0500  AST 17  --   --  1,195*  --   --  1,417*  ALT 14  --   --  184*  --   --  85*  ALKPHOS 59  --   --  31*  --   --  33*  BILITOT 1.1  --   --  1.3*  --   --  1.9*  PROT 6.5  --   --  3.3*  --   --  3.4*  ALBUMIN 4.1   < > 2.3* 2.1* 2.1* 2.2* 2.2*   < > = values in this interval not displayed.    Recent Labs  Lab 12/20/2021 1047  AMYLASE 450*   No results for input(s): "AMMONIA" in the last 168 hours.  ABG    Component Value Date/Time   PHART 7.238 (L) 12/14/2021 0512   PCO2ART 38.7 01/07/2022 0512   PO2ART 66 (L) 01/04/2022 0512   HCO3 16.7 (L) 01/11/2022 0512   TCO2 18 (L) 12/26/2021 0512   ACIDBASEDEF 10.0 (H) 12/18/2021 0512   O2SAT 90 12/27/2021 0512     Coagulation Profile: Recent Labs  Lab 01/02/2022 1954 12/23/2021 0311 December 31, 2021 0721 31-Dec-2021 2046 01/11/2022 0500  INR 1.0 1.1 1.3* 1.7* 2.7*    Cardiac Enzymes: No results for input(s): "CKTOTAL", "CKMB", "CKMBINDEX", "TROPONINI" in the last 168 hours.  HbA1C: Hgb A1c MFr Bld  Date/Time Value Ref Range Status  12/18/2021 04:45 PM 5.4 4.8 - 5.6 % Final    Comment:    (NOTE) Pre diabetes:          5.7%-6.4%  Diabetes:              >6.4%  Glycemic control for   <7.0% adults with diabetes     CBG: Recent Labs  Lab 12/18/2021 2040 12/23/2021 2244 12/16/2021 0008 12/23/2021 0225 12/23/2021 0509  GLUCAP 110* 126* 132* 129* 112*    Critical care time:     Total critical care time: 44 minutes  Performed by: Cheri Fowler   Critical care time was exclusive of separately billable procedures and treating other patients.   Critical care was necessary to treat or prevent imminent or life-threatening deterioration.   Critical care was time spent personally by me on the following activities: development of treatment plan with patient and/or surrogate as well as nursing, discussions with consultants, evaluation of patient's response to treatment, examination of patient, obtaining history from patient or surrogate, ordering and performing treatments and interventions, ordering and review of laboratory studies, ordering and review of radiographic studies, pulse oximetry and re-evaluation of patient's condition.   Cheri Fowler, MD [facility] See Amion for pager If no response to pager, please  call [phone] until 7pm After 7pm, Please call E-link [phone]

## 2022-01-12 NOTE — Progress Notes (Signed)
Patient family asked to proceed with comfort care and palliative extubation.  End of life orders were written, all meds were stopped except comfort meds  TCTS was made aware.    Cheri Fowler, MD [facility] See Amion for pager If no response to pager, please call [phone] until 7pm After 7pm, Please call E-link [phone]

## 2022-01-12 NOTE — Progress Notes (Signed)
2 Days Post-Op Procedure(s) (LRB): VENTRICULAR ASSIST DEVICE INSERTION (N/A) TEMPORARY DIALYSIS CATHETER (Left) Assessment Maxed out on drips with vasopressin 0.1 Chest x-ray this a.m. reviewed and shows interstitial edema consistent with ARDS, no significant effusions or consolidation Appreciate CCM input with ventilator management with FiO2 currently 100% PEEP 16 cm.  ABG 6 hours ago with PO2 of 66, current PaO2 down to 48.  CVVH circuit not working currently because of clotted circuit and catheter.  Plan lytic treatment of catheter and resume CVVH with citrate for anticoagulation Impella 5.5 and RP both with good flows-we will add heparin to purge solution with chest tube output minimal for the past 24 hours. Patient's left lower extremity is becoming mottled.  KUB shows no bowel dilatation.  Patient developing metabolic acidosis and bicarb deficit.  Objective: Vital signs in last 24 hours: Temp:  [95.2 F (35.1 C)-98.1 F (36.7 C)] 96.8 F (36 C) (06/19 0600) Pulse Rate:  [83-184] 89 (06/19 0600) Cardiac Rhythm: Atrial paced (06/19 0000) Resp:  [0-28] 28 (06/19 0600) BP: (61-99)/(58-71) 99/71 (06/19 0000) SpO2:  [77 %-100 %] 87 % (06/19 0600) Arterial Line BP: (61-136)/(46-71) 112/67 (06/19 0600) FiO2 (%):  [60 %-100 %] 100 % (06/19 0324)  Hemodynamic parameters for last 24 hours: PAP: (21-53)/(12-35) 53/32 CVP:  [6 mmHg-27 mmHg] 25 mmHg CO:  [2.7 L/min-3.5 L/min] 2.9 L/min CI:  [1.4 L/min/m2-1.7 L/min/m2] 1.4 L/min/m2  Intake/Output from previous day: 06/18 0701 - 06/19 0700 In: 7769 [I.V.:6120.6; Blood:290; NG/GT:60; IV Piggyback:965.2] Out: 1819 [Urine:45; Emesis/NG output:250; Chest Tube:498] Intake/Output this shift: No intake/output data recorded.  Exam  Sedated on ventilator, pupils react to light Breath sounds clear Sinus rhythm atrially paced at 90 Chest tubes patent, no airleak Abdomen distended, no bowel sounds, NG tube in place Extremities cool with some  mottling of the left lower leg and foot.  Lab Results: Recent Labs    12/24/2021 2046 12/26/2021 0500 12/24/2021 0512  WBC 9.8 8.7  --   HGB 8.2* 6.3* 5.1*  HCT 23.9* 18.0* 15.0*  PLT 64* 45*  --    BMET:  Recent Labs    12/14/2021 2046 12/16/2021 0500 01/06/2022 0512  NA 135 137 138  K 4.8 5.7* 5.7*  CL 105 108  --   CO2 23 15*  --   GLUCOSE 113* 107*  --   BUN 21* 24*  --   CREATININE 2.49* 2.80*  --   CALCIUM 6.6* 6.9*  --     PT/INR:  Recent Labs    01/03/2022 2046  LABPROT 19.5*  INR 1.7*   ABG    Component Value Date/Time   PHART 7.238 (L) 01/08/2022 0512   HCO3 16.7 (L) 12/21/2021 0512   TCO2 18 (L) 12/13/2021 0512   ACIDBASEDEF 10.0 (H) 01/08/2022 0512   O2SAT 90 12/21/2021 0512   CBG (last 3)  Recent Labs    12/25/2021 0008 12/15/2021 0225 01/04/2022 0509  GLUCAP 132* 129* 112*    Assessment/Plan: S/P Procedure(s) (LRB): VENTRICULAR ASSIST DEVICE INSERTION (N/A) TEMPORARY DIALYSIS CATHETER (Left) Patient remains critically ill without signs of improving despite biventricular mechanical support, mechanical ventilation and renal replacement therapy.  Discussion with patient's mother and family yesterday p.m.-they understand that with such severe multiorgan failure the chances of survival are dismal but are not ready to transition to comfort care.  We will ask for input from palliative care.   LOS: 3 days    Lovett Sox 12/21/2021

## 2022-01-12 NOTE — Progress Notes (Signed)
Pt. Extubated per withdrawal order, family at bedside.

## 2022-01-12 NOTE — Progress Notes (Signed)
Patient asystole on monitor. Family at bedside. Pt. Pronounced by Simmie Davies, RN and Edilia Bo, RN. Support given to family. Dr. Merrily Pew made aware and Dr. Maren Beach.

## 2022-01-12 NOTE — Discharge Summary (Unsigned)
[name], [name] MEDICAL RECORD NO: [id number] ACCOUNT NO: [id number] DATE OF BIRTH: 16-Nov-1980 FACILITY: [facility] LOCATION: [facility] PHYSICIAN: Mikey Bussing, MD  Discharge Summary   DATE OF DISCHARGE: 13-Jan-2022  ADMITTING DIAGNOSES: 1.  Acute chest pain with diagnosis of ST elevation MI, treated with urgent catheterization. 2.  History of severe hypertension. 3.  History of severe tobacco abuse. 4.  History of peripheral vascular disease, status post right iliac stent about 3 years ago.  OPERATIONS AND PROCEDURES:   1.  Emergency cardiac catheterization by Dr. Eldridge Dace, 06/16 for STEMI. 2.  Repair of acute type A ascending aortic dissection with placement of Impella 5.5 ventricular assist device by Dr. Donata Clay on 12/25/2021. 3.  Placement of Impella RP RV assist device and placement of a hemodialysis catheter in the femoral vein by Dr. Gala Romney on 12/22/2021. 4.  ICU mediastinal exploration for hematoma by Dr. Donata Clay on 01/11/2022.  HOSPITAL COURSE:  The patient is a 41 year old male with severe hypertension and previous history of coronary artery disease and stents, one of which was recently placed at an outside hospital.  He had history of reduced systolic function with EF of 25%.   He presented with severe chest pain and some EKG changes and was diagnosed as a code STEMI.  Cardiac catheterization was done emergently, which showed patent stents and no evidence of significant coronary disease.  He was sent back to the ICU where he  had persistent severe back and abdominal pain.  He then underwent a CTA with IV contrast despite his previous history of chronic kidney disease from hypertension.  This demonstrated an acute type A dissection with the interval tear occurring above the  noncoronary sinus and extending with a false lumen through the arch into the descending thoracic aorta and abdomen.  The left renal artery was perfused by the false lumen.  The SMA had poor flow due to the  false lumen compressing the true lumen.  I saw the patient in emergency consultation in the ICU after his scan and recommended emergency repair of his type A dissection.  I discussed this procedure as high risk due to his known hypertension, known LV dysfunction and the fact that the patient  was loaded with Brilinta just 2 hours previously when he had the urgent catheterization.  I counseled the patient and his mother that the surgery would take several hours and that he would be at high risk for bleeding and for requirement of blood  transfusion products and that renal function would be at risk due to 2 IV contrast loads recently.  Both he and his mother agreed to proceed with high risk of repair of his aortic dissection.  The patient was then taken directly to the operating room where he was prepared for surgery.  He remained with stable blood pressure.  Anesthesia placed PA catheter and radial artery lines and induction of general anesthesia went smoothly.  The patient  had a transesophageal echo probe, which was placed by the anesthesia team and demonstrated a dissection flap as well as mild to moderate aortic insufficiency.  The patient was prepped and draped and then underwent several hour long repair.  The ascending  aorta was badly torn.  The patient had antegrade cerebral perfusion during the circulatory arrest via cannulation of the right axillary artery.  A 28-grade straight graft Hemashield was used to replace the aorta from the sinotubular junction to the  proximal arch.  The patient separated from cardiopulmonary bypass with  the graft, functioning well without much bleeding.  However, there was significant biventricular dysfunction.  The patient's LV was extremely thick and stiff at baseline and systolic  function at baseline was also significantly reduced.  Following cardiopulmonary bypass and the long procedure the patient's LV function was worse.  For that reason, a graft was sewn on the  new aortic graft and through this an Impella 5.5 assist device  was directed to the aortic valve into the LV. Using the assist device the patient was successfully separated from cardiopulmonary bypass with inotropic support for further right ventricle dysfunction.  The patient had severe coagulopathy related to the surgery and the Brilinta load.  He received multiple units of blood, platelets, FFP and cryoprecipitate.  I elected not to close the sternum because of the RV dysfunction and closed the chest wall and  skin and the patient returned to the ICU.  The patient was treated for coagulopathy in the ICU.  His renal function remained poor. With an 8 hours of the procedure a right ventricular assist device was placed percutaneous approach by Dr. Gala Romney (Impella RP).  The patient stabilized and showed improvement in bleeding and in hemodynamics.  However, the next night and the early morning of 6:18 the patient required mediastinal exploration and removal of hematoma due to evidence of tamponade.  The patient then continued with some improvement; however, had multiorgan failure.  He is on hemodialysis, he was ventilated with 100% and high levels of PEEP and he had biventricular mechanical support.  Over the next 24 hours, pulmonary function  deteriorated with ARDS and massive transfusion and his oxygen saturation and metabolic acidosis worsened progressively.  With multiorgan failure and maximal support and not showing any improvement the situation was discussed with the patient's family and  it was felt that the potential for meaningful survival was very low.  On 12/26/2021, the patient was made a DNR and then when the family arrived comfort care was initiated and support was withdrawn and the patient expired approximately 5:00 p.m. on  06/19.  FINAL DIAGNOSES:   1.  Acute chest pain, ST elevation myocardial infarction. 2.  History of previous MI with ischemic cardiomyopathy. 3.  Poorly  controlled hypertension. 4.  Tobacco abuse. 5.  Peripheral vascular disease, status post iliac stent. 6.  Type A aortic dissection. 7.  Postoperative biventricular dysfunction. 8.  Postoperative acute on chronic renal failure. 9.  Severe coagulopathic bleeding. 10.  Postoperative ARDS following massive transfusion requirements. 11.  Multiorgan failure.   PUS D: 01/03/2022 5:17:00 pm T: 01/03/2022 7:32:00 pm  JOB: [id number]/ [id number]

## 2022-01-12 NOTE — Progress Notes (Signed)
[facility] Progress Note    Assessment/ Plan:    AKI - due to cardiogenic shock in setting of acute aortic dissection. CRRT started 6/17.  He now is maxed out on 3 pressors and milrinone with severe Bivent HF, bivent impellas, and MSOF.  His situation does not seem surviviable.  I discussed with Dr Merrily Pew- his family is set to arrive this AM to continue GOC conversations.  We will not restart CRRT until these talks have taken place- currently off with tPA as it is.  Question of whether or not citrate can be used in CRRT circuit- not advisable with shock liver.  Bivent HF w/ cardiogenic shock - on 3 pressors + milrinone, RV and LV impella devices in place.   Type A aortic dissection - sp repair 6/17  CAD - hx of prior LAD/ LCx stent. LHC here showed that the stents were patent Acute blood loss anemia: compunded by coagulopathy- tranfusing blood products prn Acute hypoxic RF: ARDS  Dispo: prognosis poor. Greatly appreciate all involved parties with GOC discussions  Subjective:    Seen in room.  Severe MSOF with essentially max support devices including bivent Impellas, vent, INO, max pressors, CRRT.  CRRT clotted this AM and large clot pulled from catheter- tPA in progress.  Ongoing acute blood loss anemia with multiple blood products being transfused.  Prognosis quite grim.   Objective:   BP 102/60   Pulse 89   Temp (!) 96.8 F (36 C)   Resp (!) 28   Ht 6\' 2"  (1.88 m)   Wt 83.9 kg   SpO2 (!) 73%   BMI 23.75 kg/m   Intake/Output Summary (Last 24 hours) at 12/27/2021 01/02/2022 Last data filed at 12/15/2021 0901 Gross per 24 hour  Intake 9015.55 ml  Output 1730 ml  Net 7285.55 ml   Weight change:   Physical Exam: Gen:ill-appearing, intubated and sedated 01/02/2022 hum Resp: coarse, wet sounding Abd: bloated Ext: 1+ anasarca  Imaging: DG Chest Port 1 View  Result Date: 12/19/2021 CLINICAL DATA:  Status post STEMI, ACS.  Patient on ECMO. EXAM: PORTABLE  CHEST 1 VIEW COMPARISON:  01/08/2022 FINDINGS: RIGHT-sided Swan-Ganz catheter tip overlies the pulmonary outflow tract. Endotracheal tube is in place, tip 5 centimeters above the carina. Nasogastric tube is in place, tip beyond the image. RIGHT-sided chest tube is in place, unchanged in position. Impella device is unchanged in position, projecting over the LEFT ventricle and aortic valve. Ventricular assist device overlies the RIGHT atrium and RIGHT ventricle. There has been improvement in bilateral LOWER lobe aeration. Focal patchy atelectasis persists at the lung bases and RIGHT lung apex. Heart size is improved. IMPRESSION: Improvement in heart size and aeration of the lungs. Unchanged support devices. Electronically Signed   By: 01/01/2022 M.D.   On: 12/13/2021 08:17   DG Abd Portable 1V  Result Date: 12/23/2021 CLINICAL DATA:  Status post STEMI. EXAM: PORTABLE ABDOMEN - 1 VIEW COMPARISON:  Jan 10, 2022. FINDINGS: No abnormal bowel dilatation is noted. Distal tip of nasogastric tube is seen in proximal stomach. Bilateral common femoral catheters are noted. IMPRESSION: No abnormal bowel dilatation. Electronically Signed   By: December 31, 2021 M.D.   On: 12/24/2021 08:04   DG CHEST PORT 1 VIEW  Result Date: 12/17/2021 CLINICAL DATA:  Code STEMI.  ARDS. EXAM: PORTABLE CHEST 1 VIEW COMPARISON:  12/16/2021 FINDINGS: Appliances are unchanged in position since previous study. Cardiac enlargement. Small pleural effusions. Bilateral perihilar infiltrates. No significant change. IMPRESSION:  Appliances appear in satisfactory position without change since prior study. Bilateral perihilar infiltrates with small effusions are also unchanged. Electronically Signed   By: Burman Nieves M.D.   On: 01/01/2022 23:38   DG CHEST PORT 1 VIEW  Result Date: 12/15/2021 CLINICAL DATA:  Respiratory failure EXAM: PORTABLE CHEST 1 VIEW COMPARISON:  Same-day radiograph FINDINGS: Endotracheal tube remains appropriately  positioned. Right IJ approach pulmonary arterial catheter terminates within the central pulmonary outflow. Enteric tube extends below the diaphragm, beyond the field of view. Right-sided chest tube is stable in positioning. Temporary ventricular assist devices are unchanged in positioning. Stable enlarged cardiomediastinal contours. Moderate bilateral pleural effusions appear increased from prior with increasing diffuse bilateral airspace opacities. No pneumothorax is seen. IMPRESSION: 1. Diffuse bilateral airspace opacities, worsened from prior, may reflect pulmonary edema versus atelectasis. 2. Moderate bilateral pleural effusions appear increased from prior. 3. Stable lines and tubes, as above. Electronically Signed   By: Duanne Guess D.O.   On: 01/06/2022 17:19   US RENAL  Result Date: 12/26/2021 CLINICAL DATA:  41 year old male with acute kidney injury. EXAM: RENAL / URINARY TRACT ULTRASOUND COMPLETE COMPARISON:  12/19/2021 CT FINDINGS: Right Kidney: Renal measurements: 8.6 x 4.8 x 5.9 cm = volume: 126 mL. Echogenicity is UPPER limits of normal. No mass or hydronephrosis visualized. Left Kidney: Renal measurements: 8.8 x 5.2 x 5.4 cm = volume: 129 mL. Echogenicity is UPPER limits of normal. No mass or hydronephrosis visualized. Bladder: Not well visualized Other: Ascites within the abdomen and pelvis noted. IMPRESSION: 1. LOWER limits of normal size kidneys with UPPER limits of normal renal echogenicity suggestive of medical renal disease. No evidence of hydronephrosis. 2. Given history of aortic dissection, renal infarcts may be present but difficult to visualize sonographically. 3. Bladder not well visualized. 4. Ascites Electronically Signed   By: Harmon Pier M.D.   On: 12/18/2021 15:24   DG Chest Port 1 View  Result Date: 01/11/2022 CLINICAL DATA:  Aortic dissection EXAM: PORTABLE CHEST 1 VIEW COMPARISON:  Chest x-ray dated December 30, 2021 FINDINGS: Unchanged position of ET tube. Right PA catheter  tip projects over the main pulmonary artery. Gastric decompression tube courses below the diaphragm. Right-sided chest tube. Temporary ventricular assist devices are unchanged in position. Small bilateral pleural effusions. No evidence of pneumothorax. IMPRESSION: 1. Stable support devices. 2. Small bilateral pleural effusions. Electronically Signed   By: Allegra Lai M.D.   On: 12/20/2021 09:07   DG CHEST PORT 1 VIEW  Result Date: 01/11/2022 CLINICAL DATA:  Hemodynamic changes of early tamponade. EXAM: PORTABLE CHEST 1 VIEW COMPARISON:  Chest radiograph dated 12/21/2021. FINDINGS: Interval placement of a chest tube with tip in the right upper lobe. There has been retraction of the right IJ Swan-Ganz catheter with tip likely in the main pulmonary trunk. Additional support apparatus in similar position. Decrease in the size of the right pleural effusion with improved aeration of the right lung. Similar aeration of the left lung. No pneumothorax. Slight interval decrease in the mediastinal widening since the prior radiograph. Stable cardiac silhouette. No acute osseous pathology. IMPRESSION: 1. Interval placement of a right-sided chest tube with significant decrease or resolution of the right pleural effusion and improvement of right lung aeration compared to prior radiograph. 2. Retraction of the right IJ Swan-Ganz catheter with tip likely in the main pulmonary trunk. 3. Decrease in the mediastinal widening. Electronically Signed   By: Elgie Collard M.D.   On: 12/19/2021 02:52   DG CHEST PORT 1 VIEW  Result Date: 25-Jan-2022 CLINICAL DATA:  Acute coronary syndrome EXAM: PORTABLE CHEST 1 VIEW COMPARISON:  01/06/2022 FINDINGS: Endotracheal and enteric tubes, right Swan-Ganz catheter, superior approach Impella device projecting over the expected location of the right ventricle. Inferior approach Impella device projecting through the right ventricle with tip over the outflow tract region. Diffuse cardiac  enlargement. Atelectasis in the lung bases. Small left pleural effusion. Increasing moderate-sized right pleural effusion. No visible pneumothorax. Subcutaneous emphysema and surgical clips in the base of the neck on the right, likely postoperative. IMPRESSION: Appliances appear in satisfactory position as discussed. Small left and moderate right pleural effusions, increasing since prior study. Basal atelectasis. Electronically Signed   By: Burman Nieves M.D.   On: 01/25/22 00:42   CARDIAC CATHETERIZATION  Result Date: 12/17/2021 Successful placement of Impella RP and temporary HD cath.   PERIPHERAL VASCULAR CATHETERIZATION  Result Date: 12/15/2021 Successful placement of Impella RP and temporary HD cath.   DG Chest Port 1 View  Result Date: 12/22/2021 CLINICAL DATA:  Aortic dissection EXAM: PORTABLE CHEST 1 VIEW COMPARISON:  Chest x-ray dated March 25, 2011 FINDINGS: ET tube tip is at the thoracic inlet, approximally 7.5 cm from the carina. Right IJ approach PA catheter projects over the expected area of the main pulmonary artery. Impella device overlies the expected area of the right ventricle. Gastric decompression tube tip and side port project over the expected area of the stomach. Mediastinal drains in place. Cardiac and mediastinal contours within normal limits. Small layering right pleural effusion. No evidence of pneumothorax. IMPRESSION: 1. ET tube tip is at the thoracic inlet, approximally 7.5 cm from the carina. 2. Small layering right pleural effusion. Electronically Signed   By: Allegra Lai M.D.   On: 01/02/2022 10:12    Labs: BMET Recent Labs  Lab 12/20/2021 2000 12/21/2021 2344 25-Jan-2022 0301 2022-01-25 [id number] 01/25/2022 0721 01/25/2022 0908 01/25/2022 1047 January 25, 2022 1318 01/25/22 1500 Jan 25, 2022 1632 Jan 25, 2022 1851 January 25, 2022 2042 01/25/2022 2046 01/02/2022 0500 12/24/2021 0512  NA 142   < > 139   < > 137   < > 139   < > 137 140 139 138 135 137 138  K 3.5   < > 3.7   < > 3.8   <  > 4.1   < > 4.5 4.5 4.8 4.9 4.8 5.7* 5.7*  CL 109  --  106  --  106  --  110  --  107  --   --   --  105 108  --   CO2 20*  --  20*  --  20*  --  20*  --  21*  --   --   --  23 15*  --   GLUCOSE 170*  --  133*  --  135*  --  136*  --  127*  --   --   --  113* 107*  --   BUN 22*  --  24*  --  22*  --  21*  --  21*  --   --   --  21* 24*  --   CREATININE 2.21*  --  2.36*  --  2.32*  --  2.31*  --  2.40*  --   --   --  2.49* 2.80*  --   CALCIUM 6.5*  --  6.8*  --  6.2*  --  6.7*  --  6.4*  --   --   --  6.6* 6.9*  --   PHOS 3.7  --  4.1  --  4.0  --   --   --  4.4  --   --   --  4.5 6.1*  --    < > = values in this interval not displayed.   CBC Recent Labs  Lab Jan 16, 2022 1645 01-16-22 1716 12/22/2021 0721 12/19/2021 0906 01/02/2022 0908 12/26/2021 2042 12/15/2021 2046 01/08/2022 0500 12/14/2021 0512  WBC 12.9*   < > 5.8 6.0  --   --  9.8 8.7  --   NEUTROABS 7.2  --   --   --   --   --   --   --   --   HGB 11.9*   < > 8.7* 8.4*   < > 7.1* 8.2* 6.3* 5.1*  HCT 36.1*   < > 24.1* 23.4*   < > 21.0* 23.9* 18.0* 15.0*  MCV 90.3   < > 81.7 81.8  --   --  85.4 86.5  --   PLT 231   < > 78* 78*  --   --  64* 45*  --    < > = values in this interval not displayed.    Medications:     sodium chloride   Intravenous Once   sodium chloride   Intravenous Once   sodium chloride   Intravenous Once   sodium chloride   Intravenous Once   acetaminophen  1,000 mg Oral Q6H   Or   acetaminophen (TYLENOL) oral liquid 160 mg/5 mL  1,000 mg Per Tube Q6H   acetaminophen (TYLENOL) oral liquid 160 mg/5 mL  650 mg Per Tube Once   Or   acetaminophen  650 mg Rectal Once   aspirin EC  325 mg Oral Daily   Or   aspirin  324 mg Per Tube Daily   bisacodyl  10 mg Oral Daily   Or   bisacodyl  10 mg Rectal Daily   Chlorhexidine Gluconate Cloth  6 each Topical Daily   docusate  100 mg Per Tube BID   fentaNYL (SUBLIMAZE) injection  50 mcg Intravenous Once   mouth rinse  15 mL Mouth Rinse Q2H   pantoprazole (PROTONIX) IV  40  mg Intravenous Q24H   polyethylene glycol  17 g Per Tube Daily   sodium bicarbonate  50 mEq Intravenous Once   sodium chloride flush  3 mL Intravenous Q12H      Bufford Buttner, MD 01/07/2022, 9:29 AM

## 2022-01-12 NOTE — IPAL (Signed)
Interdisciplinary Goals of Care Family Meeting   Date carried out:: 01/07/2022  Location of the meeting: Bedside  Member's involved: Physician, Bedside Registered Nurse, and Family Member or next of kin  Durable Power of Attorney or acting medical decision maker: Sharifa Pals    Discussion: We discussed goals of care for [employer] .    The Clinical status was relayed to patient's mother at bedside in detail.    Updated and notified of patients medical condition.   Patient remains unresponsive and will not open eyes to command.   Patient is having a weak cough and struggling to remove secretions.   Patient with increased WOB and using accessory muscles to breathe   Patient with Progressive multiorgan failure with a very high probablity of a very minimal chance of meaningful recovery despite all aggressive and optimal medical therapy.  Code status: Full DNR  Disposition: Continue current acute care until all family members come and visit him, before proceeding to comfort care    Family are satisfied with Plan of action and management. All questions answered   Cheri Fowler MD [facility] See Amion for pager If no response to pager, please call [phone] until 7pm After 7pm, Please call E-link [phone]

## 2022-01-12 NOTE — Progress Notes (Signed)
   12/13/2021 1520  Clinical Encounter Type  Visited With Family;Health care provider  Visit Type Patient actively dying;Spiritual support  Consult/Referral To Chaplain Benetta Spar)  Recommendations End of Life  Spiritual Encounters  Spiritual Needs Emotional;Grief support  Stress Factors  Family Stress Factors Loss   Chaplain provided meaningful presence, condolences, and Grief Support to family for the Loss of Mr. Ozella Almond; son; father; brother; and friend. [address] [place], [place] Halim., [phone]

## 2022-01-12 NOTE — Progress Notes (Signed)
   01-22-2022 0945  Clinical Encounter Type  Visited With Family;Health care provider (Mother: Freddy Kinne; Son Tammi Klippel; Sister & Brother-in-law)  Visit Type Critical Care;Spiritual support;Initial  Referral From Nurse Minette Brine and Altha Harm)  Consult/Referral To Chaplain Melvenia Beam)  Recommendations Comfort Care  Spiritual Encounters  Spiritual Needs Emotional;Grief support;Prayer  Stress Factors  Family Stress Factors Loss   Paged to meet with family of Mr. Journey Ratterman [id number] by Nurses Altha Harm and Minette Brine. Nurses provided chaplain with update of families plan of care for Mr. Domanski. Met patient's mother, Mrs. Sharifa Jachim; Son, Tammi Klippel; and Sister, in the [facility] waiting. Mrs. Amezcua talked of her kind, compassionate, and loving son, Andri. She explained that Mr. Bhargava other son was [place]. Chaplain provided meaningful presence and reflective conversation with patient's family. At Mrs. Panella request we shared in intercessory prayer for "God's Will, Comfort, and Peace."  [facility], [place]. Min., [phone]

## 2022-01-12 NOTE — Progress Notes (Addendum)
Advanced Heart Failure Rounding Note  PCP-Cardiologist: None   Subjective:    Events: 6/16 Presented with CP. Cath -> stents patent non-obs CAD. CT with Type A aortic dissection 6/16 Operative repair with Impella 5.5 placement and post-op shock 6/17 RP placement for RV dysfunction. CVVHD started 6/18 Developed tamponade. Chest re-explored. Diffuse coagulopathy. SVR dropping. Started empiric abx.   Remains intubated/sedated on bi-Pella support.   FiO2 100%, PEEP 16. Now on iNO 20 ppm.  On Epi 30, Milrinone 0.5, NE 50, Vaso 0.05  CO-OX 57%  Impella RP P-9 Flow 4.2 CVP 8-9 Impella CP P-7 Flow 4.6  Swan  CVP 9 PAP 53/32 CO/CI 6.2/3 SVR 813  Hgb 8.2>6.3  Platelets 231>82>64>45  On CRRT until filter and HD cath clotted this am.   Bedside echo 06/18: EF ~ 10%, RV severely HK. Small anterior effusion.   Objective:   Weight Range: 83.9 kg Body mass index is 23.75 kg/m.   Vital Signs:   Temp:  [95.2 F (35.1 C)-98.1 F (36.7 C)] 96.4 F (35.8 C) (06/19 0700) Pulse Rate:  [83-184] 86 (06/19 0700) Resp:  [0-28] 28 (06/19 0700) BP: (61-99)/(58-71) 99/71 (06/19 0000) SpO2:  [77 %-100 %] 78 % (06/19 0700) Arterial Line BP: (61-136)/(46-71) 86/56 (06/19 0700) FiO2 (%):  [60 %-100 %] 100 % (06/19 0400)    Weight change: Filed Weights   01/06/2022 1700  Weight: 83.9 kg    Intake/Output:   Intake/Output Summary (Last 24 hours) at 2022-01-28 0755 Last data filed at 2022-01-28 0700 Gross per 24 hour  Intake 7962.44 ml  Output 1869 ml  Net 6093.44 ml      Physical Exam  General:  Intubated and sedated. Anasarca. HEENT: + ETT Neck: supple. R IJ swan.  Cor: PMI nondisplaced. Aortic impella. Regular rate & rhythm. No rubs, gallops or murmurs. + CTs. Lungs: Coarse anteriorly Abdomen: Tense. Hypoactive bowel sounds. Extremities: no cyanosis, clubbing, rash, 2-3+ edema, LLE appears mottled, + RFV impella, + LFV HD cath Neuro: sedated on vent    Telemetry    Apaced 90s (Personally reviewed)   Labs    CBC Recent Labs    01/02/2022 1645 12/17/2021 1716 12/14/2021 2046 01-28-22 0500 Jan 28, 2022 0512  WBC 12.9*   < > 9.8 8.7  --   NEUTROABS 7.2  --   --   --   --   HGB 11.9*   < > 8.2* 6.3* 5.1*  HCT 36.1*   < > 23.9* 18.0* 15.0*  MCV 90.3   < > 85.4 86.5  --   PLT 231   < > 64* 45*  --    < > = values in this interval not displayed.   Basic Metabolic Panel Recent Labs    99/83/38 2046 2022/01/28 0500 Jan 28, 2022 0512  NA 135 137 138  K 4.8 5.7* 5.7*  CL 105 108  --   CO2 23 15*  --   GLUCOSE 113* 107*  --   BUN 21* 24*  --   CREATININE 2.49* 2.80*  --   CALCIUM 6.6* 6.9*  --   MG 2.1 2.2  --   PHOS 4.5 6.1*  --    Liver Function Tests Recent Labs    12/27/2021 1047 01/06/2022 1500 12/15/2021 2046 Jan 28, 2022 0500  AST 1,195*  --   --  1,417*  ALT 184*  --   --  85*  ALKPHOS 31*  --   --  33*  BILITOT 1.3*  --   --  1.9*  PROT 3.3*  --   --  3.4*  ALBUMIN 2.1*   < > 2.2* 2.2*   < > = values in this interval not displayed.   Recent Labs    12/22/2021 1047  AMYLASE 450*   Cardiac Enzymes No results for input(s): "CKTOTAL", "CKMB", "CKMBINDEX", "TROPONINI" in the last 72 hours.  BNP: BNP (last 3 results) No results for input(s): "BNP" in the last 8760 hours.  ProBNP (last 3 results) No results for input(s): "PROBNP" in the last 8760 hours.   D-Dimer No results for input(s): "DDIMER" in the last 72 hours. Hemoglobin A1C Recent Labs    12/24/2021 1645  HGBA1C 5.4   Fasting Lipid Panel Recent Labs    12/22/2021 1645  CHOL 170  HDL 48  LDLCALC 109*  TRIG 64  CHOLHDL 3.5   Thyroid Function Tests No results for input(s): "TSH", "T4TOTAL", "T3FREE", "THYROIDAB" in the last 72 hours.  Invalid input(s): "FREET3"  Other results:   Imaging    DG CHEST PORT 1 VIEW  Result Date: 12/19/2021 CLINICAL DATA:  Code STEMI.  ARDS. EXAM: PORTABLE CHEST 1 VIEW COMPARISON:  01/11/2022 FINDINGS: Appliances are unchanged in  position since previous study. Cardiac enlargement. Small pleural effusions. Bilateral perihilar infiltrates. No significant change. IMPRESSION: Appliances appear in satisfactory position without change since prior study. Bilateral perihilar infiltrates with small effusions are also unchanged. Electronically Signed   By: Burman Nieves M.D.   On: 12/13/2021 23:38   DG CHEST PORT 1 VIEW  Result Date: 12/25/2021 CLINICAL DATA:  Respiratory failure EXAM: PORTABLE CHEST 1 VIEW COMPARISON:  Same-day radiograph FINDINGS: Endotracheal tube remains appropriately positioned. Right IJ approach pulmonary arterial catheter terminates within the central pulmonary outflow. Enteric tube extends below the diaphragm, beyond the field of view. Right-sided chest tube is stable in positioning. Temporary ventricular assist devices are unchanged in positioning. Stable enlarged cardiomediastinal contours. Moderate bilateral pleural effusions appear increased from prior with increasing diffuse bilateral airspace opacities. No pneumothorax is seen. IMPRESSION: 1. Diffuse bilateral airspace opacities, worsened from prior, may reflect pulmonary edema versus atelectasis. 2. Moderate bilateral pleural effusions appear increased from prior. 3. Stable lines and tubes, as above. Electronically Signed   By: Duanne Guess D.O.   On: 01/04/2022 17:19   US RENAL  Result Date: 12/20/2021 CLINICAL DATA:  41 year old male with acute kidney injury. EXAM: RENAL / URINARY TRACT ULTRASOUND COMPLETE COMPARISON:  01/08/2022 CT FINDINGS: Right Kidney: Renal measurements: 8.6 x 4.8 x 5.9 cm = volume: 126 mL. Echogenicity is UPPER limits of normal. No mass or hydronephrosis visualized. Left Kidney: Renal measurements: 8.8 x 5.2 x 5.4 cm = volume: 129 mL. Echogenicity is UPPER limits of normal. No mass or hydronephrosis visualized. Bladder: Not well visualized Other: Ascites within the abdomen and pelvis noted. IMPRESSION: 1. LOWER limits of normal  size kidneys with UPPER limits of normal renal echogenicity suggestive of medical renal disease. No evidence of hydronephrosis. 2. Given history of aortic dissection, renal infarcts may be present but difficult to visualize sonographically. 3. Bladder not well visualized. 4. Ascites Electronically Signed   By: Harmon Pier M.D.   On: 12/25/2021 15:24     Medications:     Scheduled Medications:  sodium chloride   Intravenous Once   sodium chloride   Intravenous Once   sodium chloride   Intravenous Once   sodium chloride   Intravenous Once   acetaminophen  1,000 mg Oral Q6H   Or   acetaminophen (TYLENOL) oral  liquid 160 mg/5 mL  1,000 mg Per Tube Q6H   acetaminophen (TYLENOL) oral liquid 160 mg/5 mL  650 mg Per Tube Once   Or   acetaminophen  650 mg Rectal Once   alteplase  6 mg Intracatheter Once   aspirin EC  325 mg Oral Daily   Or   aspirin  324 mg Per Tube Daily   bisacodyl  10 mg Oral Daily   Or   bisacodyl  10 mg Rectal Daily   Chlorhexidine Gluconate Cloth  6 each Topical Daily   docusate  100 mg Per Tube BID   docusate sodium  200 mg Oral Daily   fentaNYL (SUBLIMAZE) injection  50 mcg Intravenous Once   metoprolol tartrate  12.5 mg Oral BID   Or   metoprolol tartrate  12.5 mg Per Tube BID   mouth rinse  15 mL Mouth Rinse Q2H   pantoprazole  40 mg Oral Daily   polyethylene glycol  17 g Per Tube Daily   sodium bicarbonate  50 mEq Intravenous Once   sodium chloride flush  3 mL Intravenous Q12H    Infusions:   prismasol BGK 4/2.5 500 mL/hr at 12/19/2021 2329    prismasol BGK 4/2.5 500 mL/hr at 01/04/2022 2328   sodium chloride Stopped (12/14/2021 2122)   sodium chloride     sodium chloride     amiodarone 60 mg/hr (January 24, 2022 0700)   dexmedetomidine (PRECEDEX) IV infusion 0.7 mcg/kg/hr (24-Jan-2022 0700)   epinephrine     famotidine (PEPCID) IV Stopped (12/14/2021 2235)   fentaNYL infusion INTRAVENOUS 300 mcg/hr (01-24-2022 0700)   insulin 0.4 Units/hr (24-Jan-2022 0700)   lactated  ringers     lactated ringers     lactated ringers 20 mL/hr at January 24, 2022 0700   magnesium sulfate     meropenem (MERREM) IV Stopped (January 24, 2022 0554)   midazolam 4 mg/hr (2022/01/24 0700)   milrinone 0.5 mcg/kg/min (01/24/22 0700)   nitroGLYCERIN     norepinephrine (LEVOPHED) Adult infusion 50 mcg/min (01-24-22 0700)   phenylephrine (NEO-SYNEPHRINE) Adult infusion     prismasol BGK 4/2.5 1,500 mL/hr at 12/16/2021 2329   sodium bicarbonate 25 mEq (Impella PURGE) in dextrose 5 % 1000 mL bag     sodium bicarbonate 25 mEq (Impella PURGE) in dextrose 5 % 1000 mL bag     vasopressin 0.05 Units/min (01/24/22 0700)    PRN Medications: sodium chloride, sodium chloride, sodium chloride, alteplase, dextrose, fentaNYL, heparin, lactated ringers, metoprolol tartrate, midazolam, midazolam, morphine injection, ondansetron (ZOFRAN) IV, mouth rinse, oxyCODONE, sodium chloride, sodium chloride flush, traMADol   Assessment/Plan   1. Cardiogenic shock with severe biventricular fiaiure - Baseline EF unknown. EF 25-35% during cath on admit  - Echo 6/17 EF 10% severe LVH RV severely down  - Bedside echo 06/18 EF ~ 10% RV severely HK small anterior infusion - Impella 5.5 and Impella RP in place. Flows as above - Continues on high dose inotropes - SVR dropping, Abx added 06/18 to cover possible septic component.  CO-OX marginal, 57% this am. - Resume CRRT when able for volume removal. Currently anuric. - MSOF with worsening metabolic acidosis. ? Developing gut ischemia. Diminished bowel sounds. Dissection flap extends into celiac axis and SMA on CTA. Lactic acid pending. - Overall poor prognosis. Minimal chance of meaningful recovery. Family discussion yesterday - not ready for transition to comfort care. Agree with Palliative Care consult for ongoing GOC discussions.   2. Acute Type A aortic dissection  - s/p repair 6/16 with  Impella 5.5 placement  - chest clean out 6/18 - sternum not closed (skin closed). Will  need to return to OR once volume status improved.  - plan as above   3. Acute blood loss anemia with severe coagulopathy - had chest clean out 6/18 for tamponade. Diffuse bleeding. No culprit site - Continues to bleed. Replace blood and factors  - keep hgb > 7.5. Hgb 6.3 this am.Receiving 3 u RBCs.   4. Acute hypoxic respiratory failure, post-op - Difficulty oxygenating. CCM now on board to assist with vent management - Remains on iNO 20 ppm   5. Severe HTN - BP now low with shock   6. CAD and PAD - January 26, 2022 Cath LAD and LCx stents patent. RCA 50% EF 25-35% - ASA/statin when able   7. AKI  - due to ATN - CVVHD started 6/17. Paused this am when filter and HD cath clotted.   8. Shock liver - AST 1,417, ALT 85 - INR 2.7, PTT 28 - Hemodynamic support  Length of Stay: 3  FINCH, LINDSAY N, PA-C  01/01/2022, 7:55 AM  Advanced Heart Failure Team Pager [phone] (M-F; 7a - 5p)  Please contact [facility] for night-coverage after hours (5p -7a ) and weekends on [email or web address]  Agree with above. Remains on intubated/sedated on Bi-Pella support with high-dose pressors.   CVVHD cath clotted. Struggling with oxygenation overnight. PEEP increased. iNO added. Sats still in 70s-80s. CXR with ARDS  Pump flows and waveforms look good. CT drainage has slowed.   General:  intubated/sedated HEENT: normal + ETT + scleral edema Neck: supple. RIJ swan Cor: Sternal dressing ok. + CTs. Aortic impella Lungs: coarse Abdomen: soft,  ++ distended. No hepatosplenomegaly. No bruits or masses. Hypoactive bowel sounds. Extremities: no cyanosis, clubbing, rash, 3+ edema LFV HD cath RFV Impella  Neuro: intubated sedated  Has progressive MSOF despite biventricular mechanical support. Concern for ARDS and ischemic bowel. Will continue current support. Prognosis poor.   VADs interrogated personally. Parameters stable.  CRITICAL CARE Performed by: Arvilla Meres  Total critical care time: 45  minutes  Critical care time was exclusive of separately billable procedures and treating other patients.  Critical care was necessary to treat or prevent imminent or life-threatening deterioration.  Critical care was time spent personally by me (independent of midlevel providers or residents) on the following activities: development of treatment plan with patient and/or surrogate as well as nursing, discussions with consultants, evaluation of patient's response to treatment, examination of patient, obtaining history from patient or surrogate, ordering and performing treatments and interventions, ordering and review of laboratory studies, ordering and review of radiographic studies, pulse oximetry and re-evaluation of patient's condition.  Arvilla Meres, MD  9:26 AM

## 2022-01-12 DEATH — deceased

## 2022-12-09 IMAGING — DX DG CHEST 1V PORT
1 series · 1 of 1 positions shown · non-contrast
Comparison: Chest x-ray dated December 30, 2021

CLINICAL DATA: Aortic dissection

EXAM:
PORTABLE CHEST 1 VIEW

[chest ap]
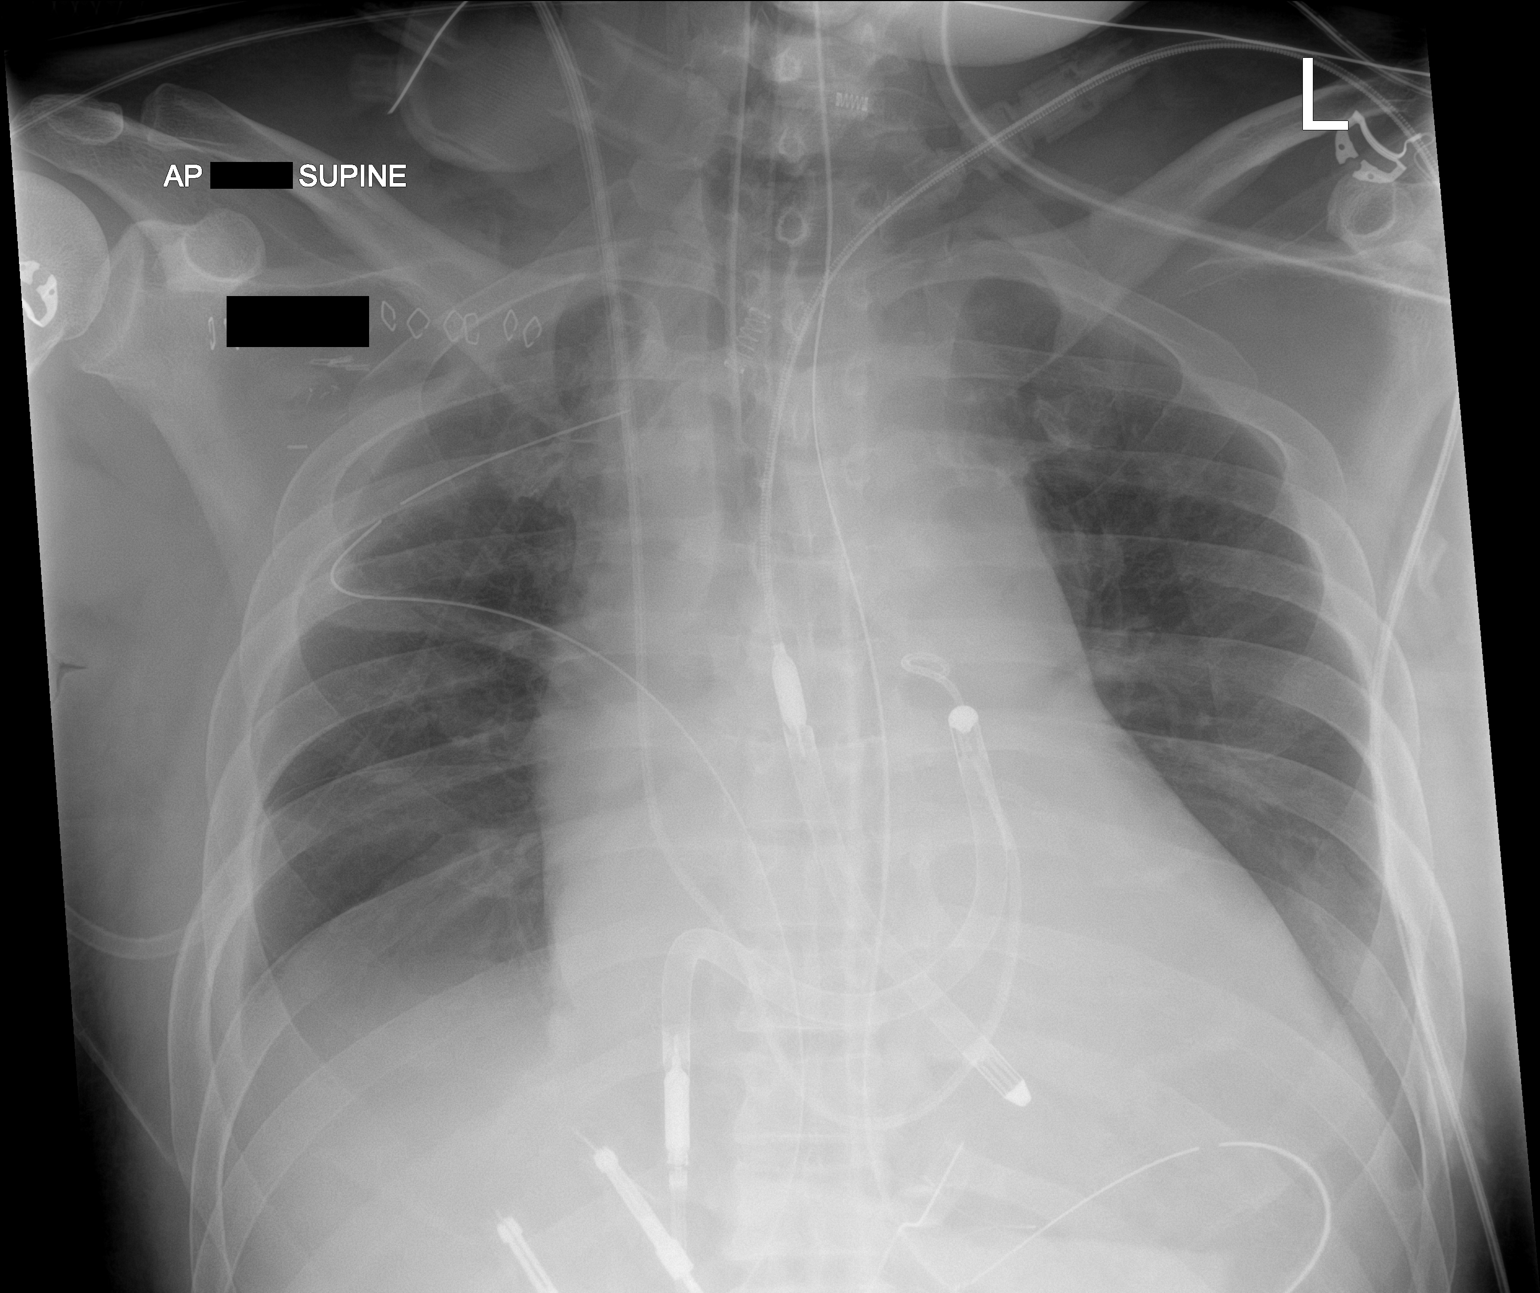

[1 of 1 positions shown; findings below may reference images not displayed]

FINDINGS: Unchanged position of ET tube. Right PA catheter tip projects over
the main pulmonary artery. Gastric decompression tube courses below
the diaphragm. Right-sided chest tube. Temporary ventricular assist
devices are unchanged in position. Small bilateral pleural
effusions. No evidence of pneumothorax.
IMPRESSION: 1. Stable support devices.
2. Small bilateral pleural effusions.

## 2022-12-10 IMAGING — DX DG ABD PORTABLE 1V
1 series · 1 of 1 positions shown · non-contrast
Comparison: December 29, 2021.

CLINICAL DATA: Status post STEMI.

EXAM:
PORTABLE ABDOMEN - 1 VIEW

[abdomen supine]
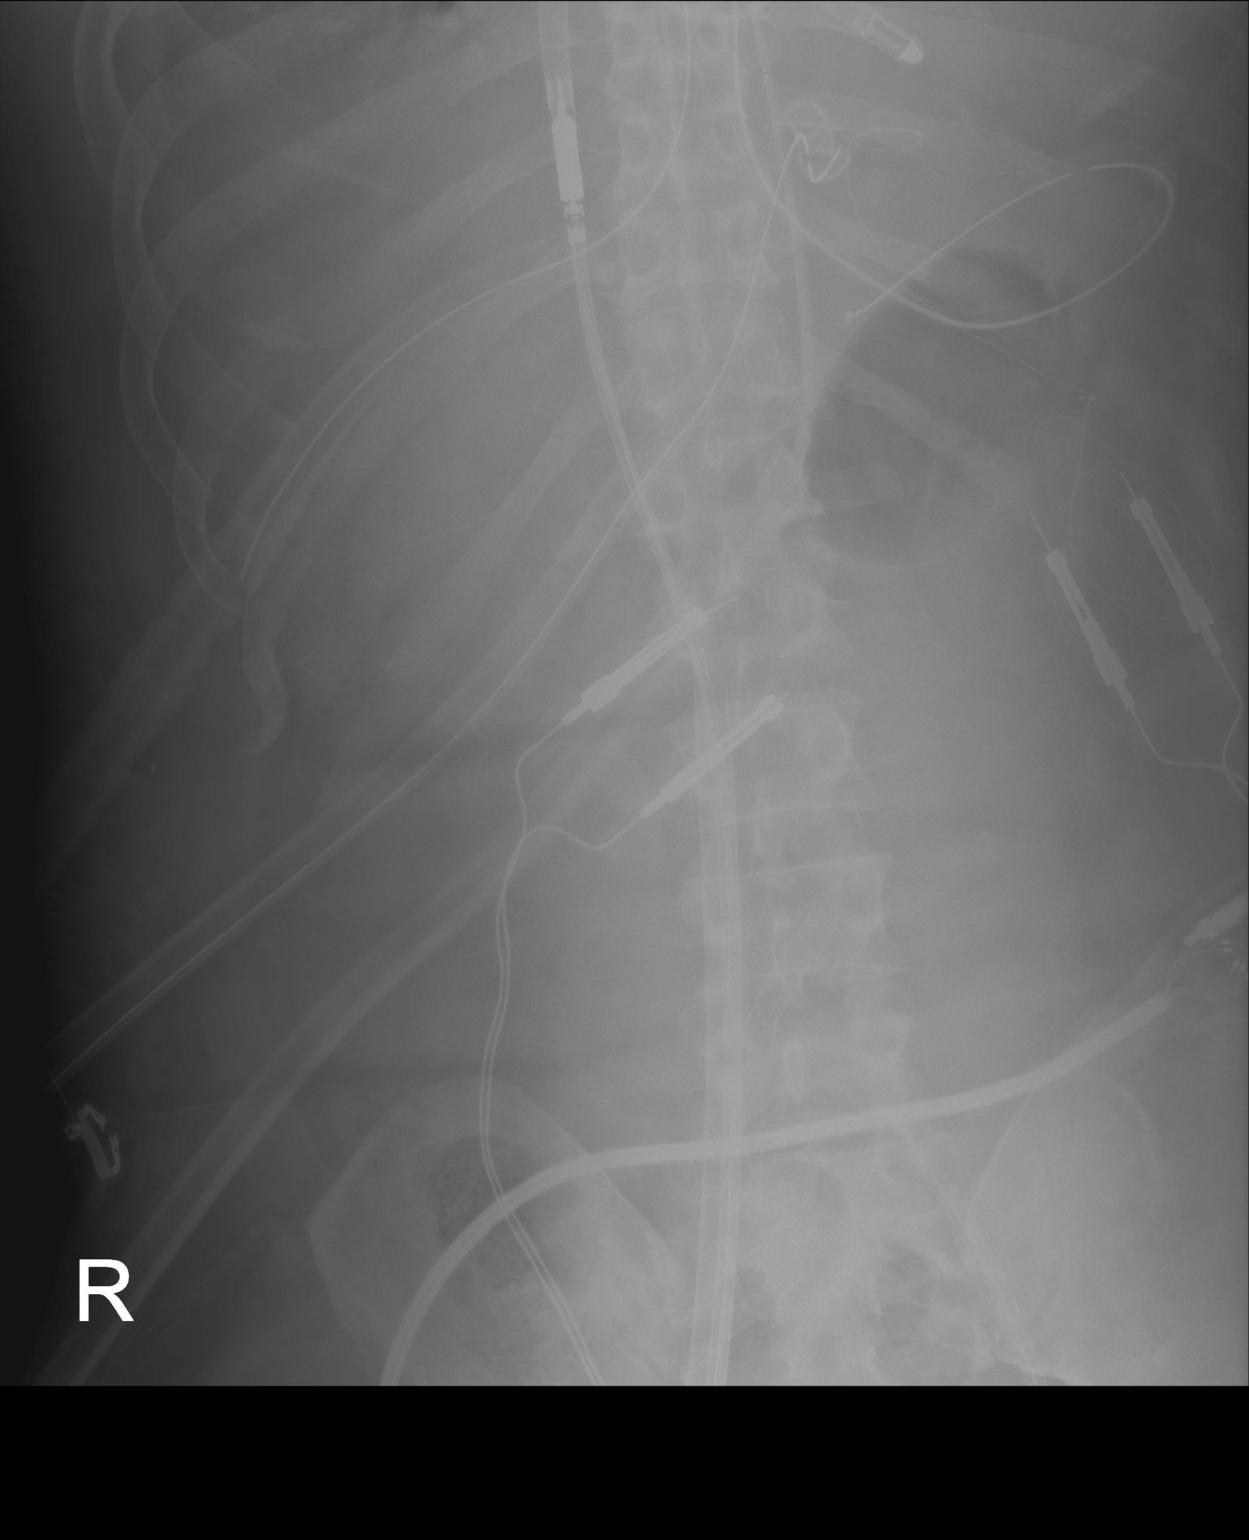

[1 of 1 positions shown; findings below may reference images not displayed]

FINDINGS: No abnormal bowel dilatation is noted. Distal tip of nasogastric
tube is seen in proximal stomach. Bilateral common femoral catheters
are noted.
IMPRESSION: No abnormal bowel dilatation.
